# Patient Record
Sex: Male | Born: 1960 | Race: White | Hispanic: No | Marital: Single | Smoking: Former smoker
Health system: Southern US, Community
[De-identification: ages and names within clinical notes are randomized; demographics above are authoritative.]

## PROBLEM LIST (undated history)

## (undated) DIAGNOSIS — I10 Essential (primary) hypertension: Secondary | ICD-10-CM

## (undated) DIAGNOSIS — N289 Disorder of kidney and ureter, unspecified: Secondary | ICD-10-CM

## (undated) DIAGNOSIS — M5020 Other cervical disc displacement, unspecified cervical region: Secondary | ICD-10-CM

## (undated) DIAGNOSIS — R569 Unspecified convulsions: Secondary | ICD-10-CM

## (undated) DIAGNOSIS — E119 Type 2 diabetes mellitus without complications: Secondary | ICD-10-CM

## (undated) HISTORY — DX: Other cervical disc displacement, unspecified cervical region: M50.20

---

## 2004-09-16 ENCOUNTER — Ambulatory Visit: Payer: Self-pay | Admitting: Family Medicine

## 2005-07-16 ENCOUNTER — Ambulatory Visit: Payer: Self-pay | Admitting: Pain Medicine

## 2005-08-06 ENCOUNTER — Emergency Department: Payer: Self-pay | Admitting: Emergency Medicine

## 2005-08-10 ENCOUNTER — Ambulatory Visit: Payer: Self-pay | Admitting: Urology

## 2005-08-22 ENCOUNTER — Ambulatory Visit: Payer: Self-pay | Admitting: Pain Medicine

## 2005-08-22 ENCOUNTER — Ambulatory Visit: Payer: Self-pay | Admitting: Urology

## 2005-09-05 ENCOUNTER — Ambulatory Visit: Payer: Self-pay | Admitting: Pain Medicine

## 2005-10-04 ENCOUNTER — Ambulatory Visit: Payer: Self-pay | Admitting: Physician Assistant

## 2006-01-29 ENCOUNTER — Ambulatory Visit: Payer: Self-pay | Admitting: Physician Assistant

## 2006-06-03 ENCOUNTER — Ambulatory Visit: Payer: Self-pay | Admitting: Physician Assistant

## 2006-09-30 ENCOUNTER — Ambulatory Visit: Payer: Self-pay | Admitting: Physician Assistant

## 2006-10-09 ENCOUNTER — Ambulatory Visit: Payer: Self-pay | Admitting: Physician Assistant

## 2006-10-29 ENCOUNTER — Ambulatory Visit: Payer: Self-pay | Admitting: Physician Assistant

## 2006-11-28 ENCOUNTER — Ambulatory Visit: Payer: Self-pay | Admitting: Physician Assistant

## 2006-12-25 ENCOUNTER — Ambulatory Visit: Payer: Self-pay | Admitting: Pain Medicine

## 2007-01-27 ENCOUNTER — Ambulatory Visit: Payer: Self-pay | Admitting: Physician Assistant

## 2007-04-28 ENCOUNTER — Ambulatory Visit: Payer: Self-pay | Admitting: Physician Assistant

## 2007-07-28 ENCOUNTER — Ambulatory Visit: Payer: Self-pay | Admitting: Physician Assistant

## 2007-08-22 ENCOUNTER — Ambulatory Visit: Payer: Self-pay | Admitting: Physician Assistant

## 2007-09-18 ENCOUNTER — Ambulatory Visit: Payer: Self-pay | Admitting: Physician Assistant

## 2007-11-18 ENCOUNTER — Ambulatory Visit: Payer: Self-pay | Admitting: Physician Assistant

## 2008-01-16 ENCOUNTER — Ambulatory Visit: Payer: Self-pay | Admitting: Physician Assistant

## 2008-03-15 ENCOUNTER — Ambulatory Visit: Payer: Self-pay | Admitting: Physician Assistant

## 2008-07-06 ENCOUNTER — Ambulatory Visit: Payer: Self-pay | Admitting: Physician Assistant

## 2008-10-05 ENCOUNTER — Ambulatory Visit: Payer: Self-pay | Admitting: Physician Assistant

## 2009-01-04 ENCOUNTER — Ambulatory Visit: Payer: Self-pay | Admitting: Physician Assistant

## 2009-03-31 ENCOUNTER — Ambulatory Visit: Payer: Self-pay | Admitting: Physician Assistant

## 2009-06-28 ENCOUNTER — Ambulatory Visit: Payer: Self-pay | Admitting: Physician Assistant

## 2009-09-01 ENCOUNTER — Ambulatory Visit: Payer: Self-pay | Admitting: Physician Assistant

## 2009-12-29 ENCOUNTER — Ambulatory Visit: Payer: Self-pay | Admitting: Physician Assistant

## 2010-01-19 ENCOUNTER — Ambulatory Visit: Payer: Self-pay | Admitting: Pain Medicine

## 2010-11-27 ENCOUNTER — Ambulatory Visit: Payer: Self-pay | Admitting: Urology

## 2013-02-04 ENCOUNTER — Ambulatory Visit: Payer: Self-pay

## 2013-02-23 ENCOUNTER — Ambulatory Visit: Payer: Self-pay

## 2013-03-02 ENCOUNTER — Ambulatory Visit: Payer: Self-pay

## 2013-10-13 ENCOUNTER — Ambulatory Visit: Payer: Self-pay | Admitting: Urology

## 2014-08-15 IMAGING — CR DG ABDOMEN 1V
1 series · 2 of 2 positions shown · non-contrast
Comparison: none

REASON FOR EXAM: kidney stone
COMMENTS:

PROCEDURE:     KDR - KDXR KIDNEY URETER BLADDER  - February 04, 2013  [DATE]
RESULT:     Comparisons:  11/27/2010

[Series 1: supine kub · 0.17mm/px · 2 of 2 slices shown]
[im 1/2]
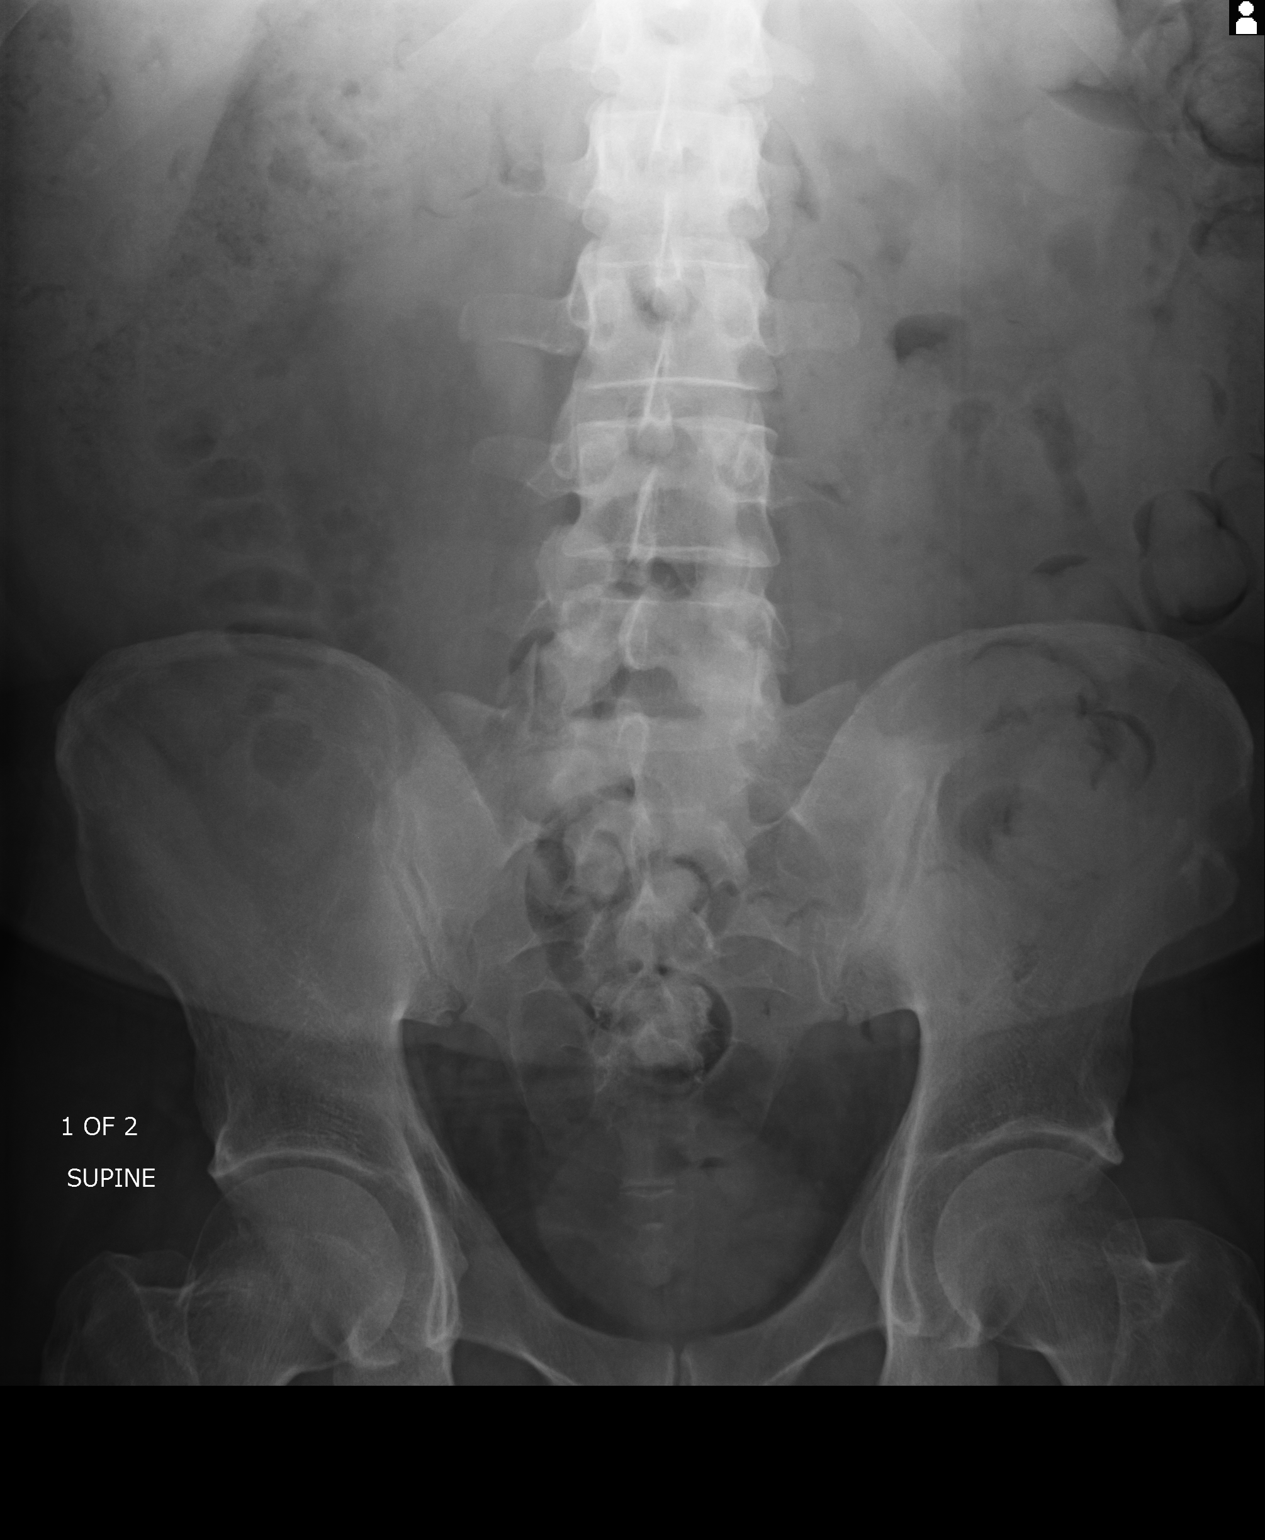
[im 2/2]
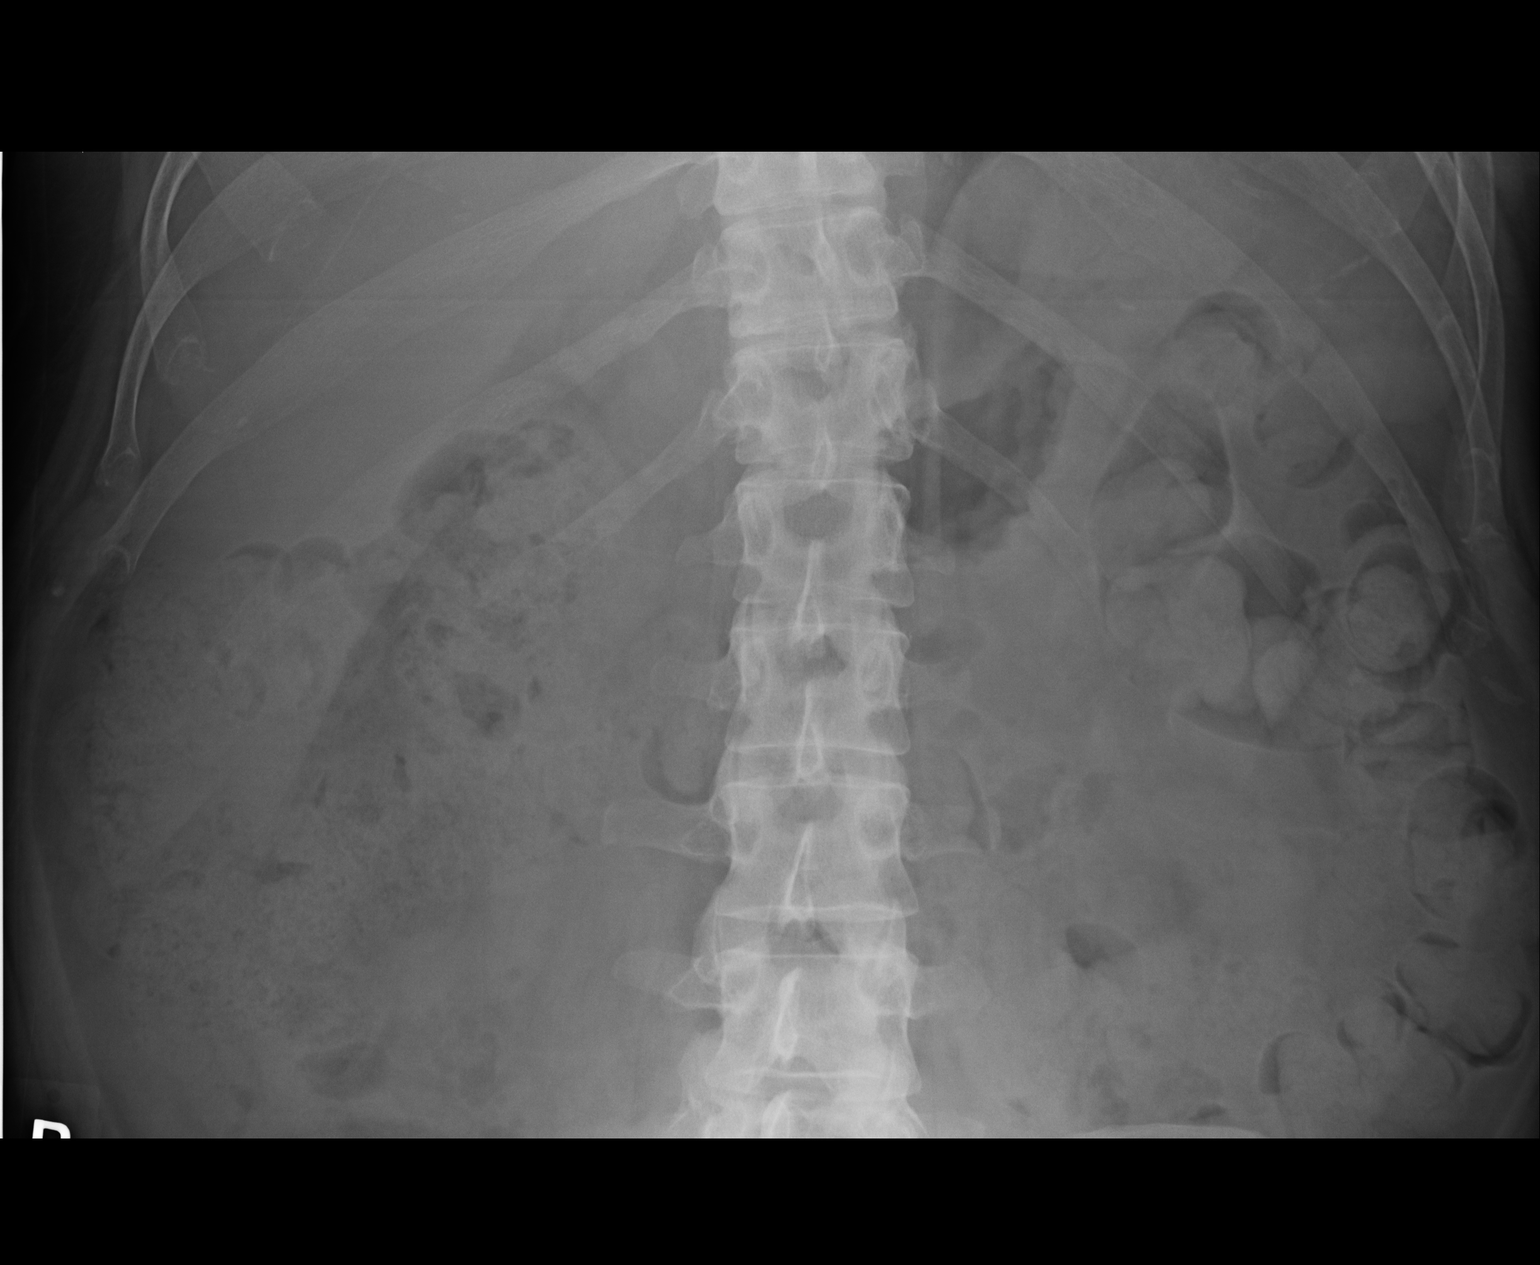

[2 of 2 positions shown; findings below may reference images not displayed]

FINDINGS: Supine radiograph of the abdomen is provided.

There is a nonspecific bowel gas pattern. There is no bowel dilatation to
suggest obstruction. Probable left nephrolithiasis. There is no pathologic
calcification along the expected course of the ureters. There is no evidence
of pneumoperitoneum, portal venous gas, or pneumatosis.

The osseous structures are unremarkable.
IMPRESSION: Probable left nephrolithiasis.

[REDACTED]

## 2017-12-11 ENCOUNTER — Other Ambulatory Visit: Payer: Self-pay | Admitting: Internal Medicine

## 2017-12-12 ENCOUNTER — Ambulatory Visit: Payer: Medicaid Other | Admitting: Nurse Practitioner

## 2017-12-12 ENCOUNTER — Encounter: Payer: Self-pay | Admitting: Nurse Practitioner

## 2017-12-12 VITALS — BP 155/90 | HR 81 | Resp 16 | Ht 71.0 in | Wt 215.8 lb

## 2017-12-12 DIAGNOSIS — E040 Nontoxic diffuse goiter: Secondary | ICD-10-CM | POA: Diagnosis not present

## 2017-12-12 DIAGNOSIS — L239 Allergic contact dermatitis, unspecified cause: Secondary | ICD-10-CM | POA: Insufficient documentation

## 2017-12-12 DIAGNOSIS — R21 Rash and other nonspecific skin eruption: Secondary | ICD-10-CM | POA: Insufficient documentation

## 2017-12-12 DIAGNOSIS — I10 Essential (primary) hypertension: Secondary | ICD-10-CM

## 2017-12-12 DIAGNOSIS — E1142 Type 2 diabetes mellitus with diabetic polyneuropathy: Secondary | ICD-10-CM | POA: Insufficient documentation

## 2017-12-12 DIAGNOSIS — E162 Hypoglycemia, unspecified: Secondary | ICD-10-CM | POA: Insufficient documentation

## 2017-12-12 DIAGNOSIS — Z125 Encounter for screening for malignant neoplasm of prostate: Secondary | ICD-10-CM | POA: Diagnosis not present

## 2017-12-12 DIAGNOSIS — K219 Gastro-esophageal reflux disease without esophagitis: Secondary | ICD-10-CM | POA: Insufficient documentation

## 2017-12-12 DIAGNOSIS — Z79891 Long term (current) use of opiate analgesic: Secondary | ICD-10-CM | POA: Insufficient documentation

## 2017-12-12 DIAGNOSIS — E1169 Type 2 diabetes mellitus with other specified complication: Secondary | ICD-10-CM | POA: Insufficient documentation

## 2017-12-12 DIAGNOSIS — E1165 Type 2 diabetes mellitus with hyperglycemia: Secondary | ICD-10-CM | POA: Diagnosis not present

## 2017-12-12 DIAGNOSIS — R635 Abnormal weight gain: Secondary | ICD-10-CM | POA: Insufficient documentation

## 2017-12-12 DIAGNOSIS — R0602 Shortness of breath: Secondary | ICD-10-CM | POA: Insufficient documentation

## 2017-12-12 DIAGNOSIS — M545 Low back pain, unspecified: Secondary | ICD-10-CM | POA: Insufficient documentation

## 2017-12-12 DIAGNOSIS — R110 Nausea: Secondary | ICD-10-CM | POA: Insufficient documentation

## 2017-12-12 DIAGNOSIS — E1159 Type 2 diabetes mellitus with other circulatory complications: Secondary | ICD-10-CM | POA: Insufficient documentation

## 2017-12-12 DIAGNOSIS — E782 Mixed hyperlipidemia: Secondary | ICD-10-CM | POA: Diagnosis not present

## 2017-12-12 DIAGNOSIS — R309 Painful micturition, unspecified: Secondary | ICD-10-CM

## 2017-12-12 DIAGNOSIS — B353 Tinea pedis: Secondary | ICD-10-CM | POA: Insufficient documentation

## 2017-12-12 DIAGNOSIS — J4520 Mild intermittent asthma, uncomplicated: Secondary | ICD-10-CM

## 2017-12-12 DIAGNOSIS — E042 Nontoxic multinodular goiter: Secondary | ICD-10-CM | POA: Diagnosis not present

## 2017-12-12 DIAGNOSIS — G8929 Other chronic pain: Secondary | ICD-10-CM | POA: Insufficient documentation

## 2017-12-12 DIAGNOSIS — N201 Calculus of ureter: Secondary | ICD-10-CM | POA: Insufficient documentation

## 2017-12-12 DIAGNOSIS — M150 Primary generalized (osteo)arthritis: Secondary | ICD-10-CM

## 2017-12-12 DIAGNOSIS — R300 Dysuria: Secondary | ICD-10-CM | POA: Insufficient documentation

## 2017-12-12 DIAGNOSIS — G894 Chronic pain syndrome: Secondary | ICD-10-CM | POA: Insufficient documentation

## 2017-12-12 DIAGNOSIS — R05 Cough: Secondary | ICD-10-CM | POA: Insufficient documentation

## 2017-12-12 DIAGNOSIS — R1084 Generalized abdominal pain: Secondary | ICD-10-CM | POA: Insufficient documentation

## 2017-12-12 DIAGNOSIS — R059 Cough, unspecified: Secondary | ICD-10-CM | POA: Insufficient documentation

## 2017-12-12 DIAGNOSIS — K5900 Constipation, unspecified: Secondary | ICD-10-CM | POA: Insufficient documentation

## 2017-12-12 DIAGNOSIS — R42 Dizziness and giddiness: Secondary | ICD-10-CM | POA: Insufficient documentation

## 2017-12-12 LAB — POCT GLYCOSYLATED HEMOGLOBIN (HGB A1C): HEMOGLOBIN A1C: 7.7

## 2017-12-12 MED ORDER — MELOXICAM 7.5 MG PO TABS: 7.5000 mg | ORAL_TABLET | Freq: Two times a day (BID) | ORAL | 3 refills | Status: DC

## 2017-12-12 MED ORDER — GLIMEPIRIDE 4 MG PO TABS: 4.0000 mg | ORAL_TABLET | Freq: Every day | ORAL | 3 refills | Status: DC

## 2017-12-12 NOTE — Progress Notes (Signed)
Subjective:     Patient ID: Joseph Stark, male   DOB: 1961/08/27, 56 y.o.   MRN: [id number]  Patient is here for routine follow up visit. He is c/o juscle pain in the jaw and neck, causing him to have headaches. 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 Unable to take muscle relaxer because he is on morphine for renal stone pain disorder. He is complaining of severe muscle cramping and pain in shoulders, neck, and lower legs . States that he had "black out" episode while in our waiting room. Has seen neurologist/endocrinologist in the past. diagnossed with pseudoseizures. Takes gabapentin to help neuropathy, which does help these black out episodes. 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    Current Outpatient Medications:  .  beclomethasone (QVAR) 40 MCG/ACT inhaler, Inhale 2 puffs into the lungs 2 (two) times daily., Disp: , Rfl:  .  fexofenadine (ALLEGRA) 180 MG tablet, Take 180 mg by mouth daily., Disp: , Rfl:  .  gabapentin (NEURONTIN) 300 MG capsule, Take 300 mg by mouth 3 (three) times daily. Take 1 cap every morning,midday, and 2 at bedtime., Disp: , Rfl:  .  glimepiride (AMARYL) 4 MG tablet, Take 4 mg by mouth daily with supper., Disp: , Rfl:  .  glucose blood test strip, 1 each by Other route 3 (three) times daily. Use as instructed, Disp: , Rfl:  .  Lancets Misc. (ACCU-CHEK SOFTCLIX LANCET DEV) KIT, by Does not apply route., Disp: , Rfl:  .  meloxicam (MOBIC) 7.5 MG tablet, TAKE 1 TABLET BY MOUTH TWICE DAILY, Disp: 60 tablet, Rfl: 0 .  metFORMIN (GLUCOPHAGE) 1000 MG tablet, Take 1,000 mg by mouth 2 (two) times daily with a meal. Take one tablet with breakfast and dinner., Disp: , Rfl:  .  metoprolol succinate (TOPROL-XL) 100 MG 24 hr tablet, Take 100 mg by mouth daily. Take with or immediately following a meal., Disp: , Rfl:  .  morphine (MSIR) 30 MG tablet, Take 30 mg by  mouth every 12 (twelve) hours., Disp: , Rfl:  .  omeprazole (PRILOSEC) 40 MG capsule, Take 40 mg by mouth daily., Disp: , Rfl:  .  pravastatin (PRAVACHOL) 40 MG tablet, Take 40 mg by mouth at bedtime., Disp: , Rfl:  .  promethazine (PHENERGAN) 12.5 MG tablet, Take 12.5 mg by mouth 2 (two) times daily as needed for nausea or vomiting., Disp: , Rfl:  .  ramipril (ALTACE) 10 MG capsule, Take 10 mg by mouth daily., Disp: , Rfl:  .  triamcinolone cream (KENALOG) 0.1 %, Apply 1 application topically as needed., Disp: , Rfl:  .  fenofibrate (TRICOR) 145 MG tablet, Take 145 mg by mouth daily., Disp: , Rfl:  .  nystatin-triamcinolone (MYCOLOG II) cream, Apply 1 application topically 2 (two) times daily., Disp: , Rfl:    Review of Systems  Constitutional: Negative.   HENT: Negative.   Eyes: Negative.   Cardiovascular: Negative.   Endocrine: Positive for heat intolerance.       States that his bloodsugars are generally running around 140 Thyroid enlarged  Musculoskeletal: Positive for arthralgias, back pain, myalgias and neck pain.  Allergic/Immunologic: Negative.   Neurological: Positive for seizures and headaches.  Hematological: Negative.   Psychiatric/Behavioral: Positive for agitation.       Today's Vitals   12/12/17 1117  BP: (!) 155/90  Pulse: 81  Resp: 16  SpO2: 96%  Weight: 215 lb 12.8 oz (97.9 kg)  Height: 5' 11"  (1.803 m)    Objective:   Physical Exam  Constitutional: He is oriented to person, place, and time. He appears well-developed and well-nourished.  HENT:  Head: Normocephalic and atraumatic.  Eyes: Pupils are equal, round, and reactive to light.  Neck: Normal range of motion. Neck supple. Thyromegaly present.  Cardiovascular: Normal rate and regular rhythm.  Pulmonary/Chest: Effort normal and breath sounds normal.  Abdominal: Soft. He exhibits distension. There is no tenderness.  Neurological: He is alert and oriented to person, place, and time.  Nursing note and  vitals reviewed.      Assessment:     Type 2 diabetes mellitus with hyperglycemia, unspecified whether long term insulin use ([place]) - Plan: POCT HgB A1C, CBC with Differential/Platelet, Comprehensive metabolic panel, Urine Microalbumin w/creat. ratio, glimepiride (AMARYL) 4 MG tablet, Consult to ophthalmology  Essential (primary) hypertension  Nontoxic multinodular goiter - Plan: T4, free  Nontoxic diffuse goiter - Plan: US Soft Tissue Head/Neck, TSH  Primary generalized (osteo)arthritis - Plan: meloxicam (MOBIC) 7.5 MG tablet  Mild intermittent asthma, uncomplicated  Mixed hyperlipidemia - Plan: Lipid panel  Screening for prostate cancer - Plan: PSA     Plan:     1. HgbA1c 7.7 today. Increased amaryl to 22m daily with meals. Reduce metformin to 1/2 tablet twice daily. Continue bydurean as prescribed.  Discussed dietary changes to help lower blood sugar.  2. bp stable. Continue bp meds as prescribed 3. Check thyroid panel and repeat thyroid ultrasound. Send results to ENT for review. 4. Renew meloxicam 7.579mtwice daily 5. Continue to use inhalers as needed and as prescribed  6. Fasting lipid panel ordered 7. PSA ordered.   Follow up 3 months and sooner if needed

## 2018-01-15 ENCOUNTER — Emergency Department: Payer: Medicaid Other

## 2018-01-15 ENCOUNTER — Emergency Department
Admission: EM | Admit: 2018-01-15 | Discharge: 2018-01-15 | Disposition: A | Discharge status: Home / Self Care | Payer: Medicaid Other | Attending: Emergency Medicine | Admitting: Emergency Medicine

## 2018-01-15 ENCOUNTER — Encounter: Payer: Self-pay | Admitting: Emergency Medicine

## 2018-01-15 ENCOUNTER — Other Ambulatory Visit: Payer: Self-pay

## 2018-01-15 ENCOUNTER — Telehealth: Payer: Self-pay

## 2018-01-15 DIAGNOSIS — I10 Essential (primary) hypertension: Secondary | ICD-10-CM | POA: Insufficient documentation

## 2018-01-15 DIAGNOSIS — R748 Abnormal levels of other serum enzymes: Secondary | ICD-10-CM | POA: Diagnosis not present

## 2018-01-15 DIAGNOSIS — Z87891 Personal history of nicotine dependence: Secondary | ICD-10-CM | POA: Diagnosis not present

## 2018-01-15 DIAGNOSIS — Z79899 Other long term (current) drug therapy: Secondary | ICD-10-CM | POA: Diagnosis not present

## 2018-01-15 DIAGNOSIS — R197 Diarrhea, unspecified: Secondary | ICD-10-CM | POA: Insufficient documentation

## 2018-01-15 DIAGNOSIS — E1143 Type 2 diabetes mellitus with diabetic autonomic (poly)neuropathy: Secondary | ICD-10-CM | POA: Insufficient documentation

## 2018-01-15 DIAGNOSIS — Z880 Allergy status to penicillin: Secondary | ICD-10-CM | POA: Insufficient documentation

## 2018-01-15 DIAGNOSIS — Z7984 Long term (current) use of oral hypoglycemic drugs: Secondary | ICD-10-CM | POA: Insufficient documentation

## 2018-01-15 HISTORY — DX: Essential (primary) hypertension: I10

## 2018-01-15 HISTORY — DX: Disorder of kidney and ureter, unspecified: N28.9

## 2018-01-15 HISTORY — DX: Unspecified convulsions: R56.9

## 2018-01-15 HISTORY — DX: Type 2 diabetes mellitus without complications: E11.9

## 2018-01-15 LAB — COMPREHENSIVE METABOLIC PANEL
ALK PHOS: 48 U/L (ref 38–126)
ALT: 44 U/L (ref 17–63)
ANION GAP: 12 (ref 5–15)
AST: 40 U/L (ref 15–41)
Albumin: 4.6 g/dL (ref 3.5–5.0)
BUN: 9 mg/dL (ref 6–20)
CALCIUM: 10.2 mg/dL (ref 8.9–10.3)
CHLORIDE: 102 mmol/L (ref 101–111)
CO2: 26 mmol/L (ref 22–32)
Creatinine, Ser: 0.98 mg/dL (ref 0.61–1.24)
GFR calc non Af Amer: >60 mL/min (ref >60)
Glucose, Bld: 169 mg/dL — ABNORMAL HIGH (ref 65–99)
Potassium: 4.5 mmol/L (ref 3.5–5.1)
SODIUM: 140 mmol/L (ref 135–145)
Total Bilirubin: 0.4 mg/dL (ref 0.3–1.2)
Total Protein: 8.2 g/dL — ABNORMAL HIGH (ref 6.5–8.1)

## 2018-01-15 LAB — CBC
HCT: 42 % (ref 40.0–52.0)
HEMOGLOBIN: 14.1 g/dL (ref 13.0–18.0)
MCH: 30.6 pg (ref 26.0–34.0)
MCHC: 33.6 g/dL (ref 32.0–36.0)
MCV: 91 fL (ref 80.0–100.0)
Platelets: 260 10*3/uL (ref 150–440)
RBC: 4.61 MIL/uL (ref 4.40–5.90)
RDW: 14.1 % (ref 11.5–14.5)
WBC: 7.3 10*3/uL (ref 3.8–10.6)

## 2018-01-15 LAB — LIPASE, BLOOD: Lipase: 78 U/L — ABNORMAL HIGH (ref 11–51)

## 2018-01-15 MED ORDER — SODIUM CHLORIDE 0.9 % IV BOLUS (SEPSIS)
1000.0000 mL | INTRAVENOUS | Status: AC
  Administered 2018-01-15: 1000 mL via INTRAVENOUS

## 2018-01-15 MED ORDER — LOPERAMIDE HCL 2 MG PO CAPS
4.0000 mg | ORAL_CAPSULE | Freq: Once | ORAL | Status: AC
  Administered 2018-01-15: 4 mg via ORAL
  Filled 2018-01-15: qty 2

## 2018-01-15 NOTE — ED Notes (Signed)
First Nurse Note:  Patient states he was at Urgent Care and is here because he is "dehydrated".  Lips appear dry and flaky.  Color is pale.  Placed in [place].  Patient states "it's my Metformin".

## 2018-01-15 NOTE — ED Provider Notes (Signed)
[facility] Emergency Department Provider Note  ____________________________________________   First MD Initiated Contact with Patient 01/15/18 1453     (approximate)  I have reviewed the triage vital signs and the nursing notes.   HISTORY  Chief Complaint Diarrhea    HPI Joseph Stark is a 57 y.o. male with medical history as listed below who presents for evaluation of severe diarrhea.  Reports that it started acutely during the early morning at about 5:30 AM and that he has been to the bathroom at least 8 times and used at least 3 rolls of toilet paper.   He says this happens to him about every 90 days and thinks that it is his body's way of building up Metformin in his system and then clearing it all out.  He does not think he needs to be on as much metformin and believes that is the reason he is having stomach issues.  He also said that he is working with his primary care doctor about his neck and a goiter and has a follow-up appointment in 5 days to have an ultrasound of his thyroid to make sure he does not have thyroid cancer or does not need a biopsy.  He thinks this might be messing up his bowels as well.  He has had no recent medication changes.  He says that he has the issues with diarrhea about every 90 days but this is more severe.  He has tried some Imodium but it has not helped.  He denies fever/chills, chest pain, shortness of breath, vomiting, and abdominal pain.  He has had some nausea but not recently.  He  Denies dysuria but also says he has not peed for 10 hours and he is afraid he is dehydrated. Past Medical History:  Diagnosis Date  . Diabetes mellitus without complication ([place])   . Hypertension   . Renal disorder   . Seizures [facility])     Patient Active Problem List   Diagnosis Date Noted  . Rash and other nonspecific skin eruption 12/12/2017  . Nontoxic multinodular goiter 12/12/2017  . Calculus of ureter 12/12/2017  . Nontoxic  diffuse goiter 12/12/2017  . Allergic contact dermatitis, unspecified cause 12/12/2017  . Essential (primary) hypertension 12/12/2017  . Chronic pain syndrome 12/12/2017  . Hypoglycemia, unspecified 12/12/2017  . Primary generalized (osteo)arthritis 12/12/2017  . Low back pain 12/12/2017  . Dizziness and giddiness 12/12/2017  . Constipation, unspecified 12/12/2017  . Gastro-esophageal reflux disease without esophagitis 12/12/2017  . Hypercalcemia 12/12/2017  . Nausea 12/12/2017  . Painful micturition 12/12/2017  . Generalized abdominal pain 12/12/2017  . Long term (current) use of opiate analgesic 12/12/2017  . Type 2 diabetes mellitus with diabetic polyneuropathy ([place]) 12/12/2017  . Tinea pedis 12/12/2017  . Cough 12/12/2017  . Mild intermittent asthma, uncomplicated [id number]/3709  . Shortness of breath 12/12/2017  . Abnormal weight gain 12/12/2017  . Mixed hyperlipidemia 12/12/2017  . Type 2 diabetes mellitus with hyperglycemia ([place]) 12/12/2017    History reviewed. No pertinent surgical history.  Prior to Admission medications   Medication Sig Start Date End Date Taking? Authorizing Provider  beclomethasone (QVAR) 40 MCG/ACT inhaler Inhale 2 puffs into the lungs 2 (two) times daily.    [provider]  fenofibrate (TRICOR) 145 MG tablet Take 145 mg by mouth daily.    [provider]  fexofenadine (ALLEGRA) 180 MG tablet Take 180 mg by mouth daily.    [provider]  gabapentin (NEURONTIN) 300 MG  capsule Take 300 mg by mouth 3 (three) times daily. Take 1 cap every morning,midday, and 2 at bedtime.    [provider]  glimepiride (AMARYL) 4 MG tablet Take 1 tablet (4 mg total) by mouth daily with supper. 12/12/17   Ronnell Freshwater, NP  glucose blood test strip 1 each by Other route 3 (three) times daily. Use as instructed    [provider]  Lancets Misc. (ACCU-CHEK SOFTCLIX LANCET DEV) KIT by Does not apply route.    [provider]  meloxicam (MOBIC) 7.5 MG tablet Take 1 tablet (7.5 mg total) by mouth 2 (two) times daily. 12/12/17   Ronnell Freshwater, NP  metFORMIN (GLUCOPHAGE) 1000 MG tablet Take 1,000 mg by mouth 2 (two) times daily with a meal. Take one tablet with breakfast and dinner.    [provider]  metoprolol succinate (TOPROL-XL) 100 MG 24 hr tablet Take 100 mg by mouth daily. Take with or immediately following a meal.    [provider]  morphine (MSIR) 30 MG tablet Take 30 mg by mouth every 12 (twelve) hours.    [provider]  nystatin-triamcinolone (MYCOLOG II) cream Apply 1 application topically 2 (two) times daily.    [provider]  omeprazole (PRILOSEC) 40 MG capsule Take 40 mg by mouth daily.    [provider]  pravastatin (PRAVACHOL) 40 MG tablet Take 40 mg by mouth at bedtime.    [provider]  promethazine (PHENERGAN) 12.5 MG tablet Take 12.5 mg by mouth 2 (two) times daily as needed for nausea or vomiting.    [provider]  ramipril (ALTACE) 10 MG capsule Take 10 mg by mouth daily.    [provider]  triamcinolone cream (KENALOG) 0.1 % Apply 1 application topically as needed.    [provider]    Allergies Bacitracin; Celebrex [celecoxib]; Penicillins; and Toradol [ketorolac tromethamine]  No family history on file.  Social History Social History   Tobacco Use  . Smoking status: Former Research scientist (life sciences)  . Smokeless tobacco: Never Used  . Tobacco comment: quit 25 years ago  Substance Use Topics  . Alcohol use: No    Frequency: Never  . Drug use: No    Review of Systems Constitutional: No fever/chills Eyes: No visual changes. ENT: No sore throat but he does report having a very dry mouth Cardiovascular: Denies chest pain. Respiratory: Denies shortness of breath. Gastrointestinal: Severe diarrhea as described above.  No recent nausea, no vomiting, no abdominal pain. Genitourinary: Decreased  urination today.  Negative for dysuria. Musculoskeletal: Negative for neck pain.  Negative for back pain. Integumentary: Negative for rash. Neurological: Negative for headaches, focal weakness or numbness.   ____________________________________________   PHYSICAL EXAM:  VITAL SIGNS: ED Triage Vitals  Enc Vitals Group     BP 01/15/18 1215 (!) 160/76     Pulse Rate 01/15/18 1215 95     Resp 01/15/18 1215 16     Temp 01/15/18 1215 98.1 F (36.7 C)     Temp Source 01/15/18 1215 Oral     SpO2 01/15/18 1215 94 %     Weight 01/15/18 1219 97.5 kg (215 lb)     Height --      Head Circumference --      Peak Flow --      Pain Score --      Pain Loc --      Pain Edu? --      Excl. in [place]? --  Constitutional: Alert and oriented. Well appearing and in no acute distress though he does appear pale Eyes: Conjunctivae are normal.  Head: Atraumatic. Nose: No congestion/rhinnorhea. Mouth/Throat: Mucous membranes are moist. Neck: No stridor.  No meningeal signs.   Cardiovascular: Borderline tachycardia, regular rhythm. Good peripheral circulation. Grossly normal heart sounds. Respiratory: Normal respiratory effort.  No retractions. Lungs CTAB. Gastrointestinal: Soft and nontender with no rebound and no guarding.  Nondistended Musculoskeletal: No lower extremity tenderness nor edema. No gross deformities of extremities. Neurologic:  Normal speech and language. No gross focal neurologic deficits are appreciated.  Skin:  Skin is pale, warm, dry and intact. No rash noted. Psychiatric: Mood and affect are normal. Speech and behavior are normal.  ____________________________________________   LABS (all labs ordered are listed, but only abnormal results are displayed)  Labs Reviewed  LIPASE, BLOOD - Abnormal; Notable for the following components:      Result Value   Lipase 78 (*)    All other components within normal limits  COMPREHENSIVE METABOLIC PANEL - Abnormal; Notable for the  following components:   Glucose, Bld 169 (*)    Total Protein 8.2 (*)    All other components within normal limits  CBC  URINALYSIS, COMPLETE (UACMP) WITH MICROSCOPIC   ____________________________________________  EKG  None - EKG not ordered by ED physician ____________________________________________  RADIOLOGY Ursula Alert, personally viewed and evaluated these images (plain radiographs) as part of my medical decision making, as well as reviewing the written report by the radiologist.  ED MD interpretation:  No acute abnormalities on acute abdomen series  Official radiology report(s): Dg Abdomen Acute W/chest  Result Date: 01/15/2018 CLINICAL DATA:  Dehydration EXAM: DG ABDOMEN ACUTE W/ 1V CHEST COMPARISON:  Abdominal radiograph dated 10/05/2013. FINDINGS: Lungs are clear.  No pleural effusion or pneumothorax. Heart is normal in size. Nonobstructive bowel gas pattern. No evidence of free air under the diaphragm on the upright view. Mild thoracic dextroscoliosis. IMPRESSION: No evidence of acute cardiopulmonary disease. No evidence of small bowel obstruction or free air. Electronically Signed   By: Julian Hy M.D.   On: 01/15/2018 15:50     ____________________________________________   PROCEDURES  Critical Care performed: No   Procedure(s) performed:   Procedures   ____________________________________________   INITIAL IMPRESSION / ASSESSMENT AND PLAN / ED COURSE  As part of my medical decision making, I reviewed the following data within the [place] notes reviewed and incorporated, Labs reviewed  and Radiograph reviewed     Differential diagnosis includes, but is not limited to, viral diarrheal illness, bacterial colitis, medication side effect, metabolic abnormality.  In spite of the patient's profuse diarrhea, his lab results are reassuring with no evidence of renal dysfunction nor electrolyte abnormality.  He has a very  slightly elevated lipase which is nonspecific and could be a result of the diarrhea.  I think it is less likely he has acute pancreatitis given no abdominal pain nor tenderness and no vomiting.  He does state that he has diarrhea every time he tries to eat or drink something but this would be an unusual presentation of pancreatitis.  I will give him a dose of loperamide, check acute abdomen radiographs for air-fluid levels, and give him 1-2 L of fluid (depending upon how he feels).  He is comfortable following up with his doctor next week as planned, he is just very worried (almost perseverating) about being dehydrated.   Clinical Course as of Jan 15 1637  Wed  Jan 15, 2018  1556 No evidence of acute abnormalities DG Abdomen Acute W/Chest [CF]  1636 Patient states he feels better and is ready to go.    I gave my usual and customary return precautions.   [CF]    Clinical Course User Index [CF] Hinda Kehr, MD     ____________________________________________  FINAL CLINICAL IMPRESSION(S) / ED DIAGNOSES  Final diagnoses:  Diarrhea, unspecified type  Elevated lipase     MEDICATIONS GIVEN DURING THIS VISIT:  Medications  sodium chloride 0.9 % bolus 1,000 mL (1,000 mLs Intravenous New Bag/Given 01/15/18 1506)  loperamide (IMODIUM) capsule 4 mg (4 mg Oral Given 01/15/18 1509)      ED Discharge Orders    None       Note:  This document was prepared using Dragon voice recognition software and may include unintentional dictation errors.    Hinda Kehr, MD 01/15/18 [phone]

## 2018-01-15 NOTE — ED Notes (Signed)
ED Provider at bedside. 

## 2018-01-15 NOTE — ED Triage Notes (Signed)
Pt to ED via POV, pt states that he has been having diarrhea since this morning at 0530. Pt denies N/V or abdominal pain. Pt states that he may be dehydrated. Pt in NAD at this time.

## 2018-01-15 NOTE — Telephone Encounter (Signed)
UNABLE TO REACH PT, TRIED CALLING TO [place] [facility] FOR 01/20/18

## 2018-01-15 NOTE — ED Notes (Signed)
Repeat VS: Pulse 0x 97%, BP - 131/80, P-81, R-20.  Alert and oriented.  Wait explained to patient.

## 2018-01-20 ENCOUNTER — Other Ambulatory Visit: Payer: Self-pay

## 2018-01-22 ENCOUNTER — Other Ambulatory Visit: Payer: Self-pay | Admitting: Internal Medicine

## 2018-01-27 ENCOUNTER — Other Ambulatory Visit: Payer: Self-pay

## 2018-02-03 ENCOUNTER — Ambulatory Visit: Payer: Medicaid Other

## 2018-02-03 DIAGNOSIS — E040 Nontoxic diffuse goiter: Secondary | ICD-10-CM

## 2018-02-20 ENCOUNTER — Other Ambulatory Visit: Payer: Self-pay | Admitting: Internal Medicine

## 2018-03-07 ENCOUNTER — Ambulatory Visit: Payer: Self-pay | Admitting: Nurse Practitioner

## 2018-03-12 ENCOUNTER — Other Ambulatory Visit: Payer: Self-pay | Admitting: Internal Medicine

## 2018-03-31 ENCOUNTER — Ambulatory Visit: Payer: Medicaid Other | Admitting: Nurse Practitioner

## 2018-03-31 ENCOUNTER — Encounter: Payer: Self-pay | Admitting: Nurse Practitioner

## 2018-03-31 VITALS — BP 143/81 | HR 82 | Resp 16 | Ht 71.0 in | Wt 214.8 lb

## 2018-03-31 DIAGNOSIS — E042 Nontoxic multinodular goiter: Secondary | ICD-10-CM | POA: Diagnosis not present

## 2018-03-31 DIAGNOSIS — E782 Mixed hyperlipidemia: Secondary | ICD-10-CM | POA: Diagnosis not present

## 2018-03-31 DIAGNOSIS — I10 Essential (primary) hypertension: Secondary | ICD-10-CM

## 2018-03-31 DIAGNOSIS — E1165 Type 2 diabetes mellitus with hyperglycemia: Secondary | ICD-10-CM

## 2018-03-31 LAB — POCT GLYCOSYLATED HEMOGLOBIN (HGB A1C): HEMOGLOBIN A1C: 8.1

## 2018-03-31 MED ORDER — GLIMEPIRIDE 4 MG PO TABS: 4.0000 mg | ORAL_TABLET | Freq: Two times a day (BID) | ORAL | 3 refills | Status: DC

## 2018-03-31 MED ORDER — METFORMIN HCL 1000 MG PO TABS: 500.0000 mg | ORAL_TABLET | Freq: Two times a day (BID) | ORAL | 7 refills | Status: DC

## 2018-03-31 NOTE — Progress Notes (Signed)
[facility] [place], [place] [address]  Internal MEDICINE  Office Visit Note  Patient Name: Joseph Stark  [id number]  [id number]  Date of Service: 04/20/2018    Pt is here for routine follow up.   Chief Complaint  Patient presents with  . Diabetes    numbness in feet , pain in right hallux  . Osteoarthritis    pain in hands, kenes, and feet.    The patient is here for routine follow up visit. Blood sugars are elevated. Reduced dose of metformin at night due to development of diarrhea. HgbA1c has increased to 8.0 today, up from 7.7. He states that every time he takes increased dose of metformin at night, diarrhea starts again, every time. He also states that he has pain in the hands, knees, feet, and lower back. States that neurosurgeon diagnosed him with rheumatoid arthritis several years ago. Is taking meloxicam as needed, but feels like it's not helping as much as it used to.      Current Medication: Outpatient Encounter Medications as of 03/31/2018  Medication Sig  . beclomethasone (QVAR) 40 MCG/ACT inhaler Inhale 2 puffs into the lungs 2 (two) times daily.  Marland Kitchen BYDUREON 2 MG PEN USE 2 MG PEN INJECTED [place] ONCE WEEKLY. TO REPLACE BYETTA  . fenofibrate (TRICOR) 145 MG tablet Take 145 mg by mouth daily.  . fexofenadine (ALLEGRA) 180 MG tablet Take 180 mg by mouth daily.  Marland Kitchen gabapentin (NEURONTIN) 300 MG capsule Take 300 mg by mouth 3 (three) times daily. Take 1 cap every morning,midday, and 2 at bedtime.  Marland Kitchen glimepiride (AMARYL) 4 MG tablet Take 1 tablet (4 mg total) by mouth 2 (two) times daily.  Marland Kitchen glucose blood test strip 1 each by Other route 3 (three) times daily. Use as instructed  . Lancets Misc. (ACCU-CHEK SOFTCLIX LANCET DEV) KIT by Does not apply route.  . meloxicam (MOBIC) 7.5 MG tablet Take 1 tablet (7.5 mg total) by mouth 2 (two) times daily.  . metFORMIN (GLUCOPHAGE) 1000 MG tablet Take 0.5 tablets (500 mg total) by mouth 2 (two) times  daily with a meal.  . metoprolol succinate (TOPROL-XL) 100 MG 24 hr tablet TAKE 1 TABLET BY MOUTH EVERY DAY  . morphine (MSIR) 30 MG tablet Take 30 mg by mouth every 12 (twelve) hours.  Marland Kitchen nystatin-triamcinolone (MYCOLOG II) cream Apply 1 application topically 2 (two) times daily.  Marland Kitchen omeprazole (PRILOSEC) 40 MG capsule Take 40 mg by mouth daily.  . pravastatin (PRAVACHOL) 40 MG tablet TAKE 1 TABLET BY MOUTH EVERYDAY AT BEDTIME  . promethazine (PHENERGAN) 12.5 MG tablet Take 12.5 mg by mouth 2 (two) times daily as needed for nausea or vomiting.  . ramipril (ALTACE) 10 MG capsule Take 10 mg by mouth daily.  Marland Kitchen triamcinolone cream (KENALOG) 0.1 % Apply 1 application topically as needed.  . [DISCONTINUED] glimepiride (AMARYL) 4 MG tablet Take 1 tablet (4 mg total) by mouth daily with supper.  . [DISCONTINUED] metFORMIN (GLUCOPHAGE) 1000 MG tablet TAKE 1 TABLET BY MOUTH TWICE A DAY WITH BREAKFAST AND DINNER   No facility-administered encounter medications on file as of 03/31/2018.     Surgical History: No past surgical history on file.  Medical History: Past Medical History:  Diagnosis Date  . Diabetes mellitus without complication ([place])   . Hypertension   . Renal disorder   . Seizures ([place])     Family History: No family history on file.  Social History   Socioeconomic History  .  Marital status: Single    Spouse name: Not on file  . Number of children: Not on file  . Years of education: Not on file  . Highest education level: Not on file  Occupational History  . Not on file  Social Needs  . Financial resource strain: Not on file  . Food insecurity:    Worry: Not on file    Inability: Not on file  . Transportation needs:    Medical: Not on file    Non-medical: Not on file  Tobacco Use  . Smoking status: Former Research scientist (life sciences)  . Smokeless tobacco: Never Used  . Tobacco comment: quit 25 years ago  Substance and Sexual Activity  . Alcohol use: No    Frequency: Never  . Drug use: No   . Sexual activity: Not Currently  Lifestyle  . Physical activity:    Days per week: Not on file    Minutes per session: Not on file  . Stress: Not on file  Relationships  . Social connections:    Talks on phone: Not on file    Gets together: Not on file    Attends religious service: Not on file    Active member of club or organization: Not on file    Attends meetings of clubs or organizations: Not on file    Relationship status: Not on file  . Intimate partner violence:    Fear of current or ex partner: Not on file    Emotionally abused: Not on file    Physically abused: Not on file    Forced sexual activity: Not on file  Other Topics Concern  . Not on file  Social History Narrative  . Not on file      Review of Systems  Constitutional: Negative.   HENT: Negative.   Eyes: Negative.   Cardiovascular: Negative.   Endocrine: Positive for heat intolerance.       States that his bloodsugars are generally running around 140 Thyroid enlarged  Musculoskeletal: Positive for arthralgias, back pain, myalgias and neck pain.  Skin: Negative for rash.  Allergic/Immunologic: Negative.   Neurological: Positive for seizures and headaches.  Hematological: Negative.   Psychiatric/Behavioral: Positive for agitation.    Today's Vitals   03/31/18 1113  BP: (!) 143/81  Pulse: 82  Resp: 16  SpO2: 95%  Weight: 214 lb 12.8 oz (97.4 kg)  Height: 5' 11"  (1.803 m)     Physical Exam  Constitutional: He is oriented to person, place, and time. He appears well-developed and well-nourished.  HENT:  Head: Normocephalic and atraumatic.  Nose: Nose normal.  Eyes: Pupils are equal, round, and reactive to light. Conjunctivae and EOM are normal.  Neck: Normal range of motion. Neck supple. Thyromegaly present.  Cardiovascular: Normal rate and regular rhythm.  Pulmonary/Chest: Effort normal and breath sounds normal.  Abdominal: Soft. Bowel sounds are normal. He exhibits no distension. There is  no tenderness.  Lymphadenopathy:    He has no cervical adenopathy.  Neurological: He is alert and oriented to person, place, and time.  Skin: No rash noted.  Psychiatric: He has a normal mood and affect. His behavior is normal. Judgment and thought content normal.  Nursing note and vitals reviewed.   Assessment/Plan:  1. Uncontrolled type 2 diabetes mellitus with hyperglycemia (HCC) - POCT HgB A1C 8.1 today. Increase metformin to 523m twice daiy with meals. increrase amaryl 479mto twice daily. Encourage him to follow ADA diet.  - metFORMIN (GLUCOPHAGE) 1000 MG tablet; Take 0.5  tablets (500 mg total) by mouth 2 (two) times daily with a meal.  Dispense: 60 tablet; Refill: 7  2. Type 2 diabetes mellitus with hyperglycemia, unspecified whether long term insulin use (HCC) - glimepiride (AMARYL) 4 MG tablet; Take 1 tablet (4 mg total) by mouth 2 (two) times daily.  Dispense: 60 tablet; Refill: 3  3. Mixed hyperlipidemia Patient needing to have fasting lipid panel checked. Adjust statin as indicated, based on lab results.   4. Essential (primary) hypertension Stable. Continue bp medication as prescribed.   5. Multinodular goiter -  Reviewed ultrasound results showing diffuse multinodular goiter. essentially unchanged from prior study. Will monitor closely. Consider referral to endocrinology after lab results available.    General Counseling: brittain hosie understanding of the findings of todays visit and agrees with plan of treatment. I have discussed any further diagnostic evaluation that may be needed or ordered today. We also reviewed his medications today. he has been encouraged to call the office with any questions or concerns that should arise related to todays visit.  Diabetes Counseling:  1. Addition of ACE inh/ ARB'S for nephroprotection. 2. Diabetic foot care, prevention of complications.  3.Exercise and lose weight.  4. Diabetic eye examination, 5. Monitor blood sugar  closlely. nutrition counseling.  6.Sign and symptoms of hypoglycemia including shaking sweating,confusion and headaches.   This patient was seen by Leretha Pol, FNP- C in Collaboration with Dr Lavera Guise as a part of collaborative care agreement    Orders Placed This Encounter  Procedures  . POCT HgB A1C    Meds ordered this encounter  Medications  . glimepiride (AMARYL) 4 MG tablet    Sig: Take 1 tablet (4 mg total) by mouth 2 (two) times daily.    Dispense:  60 tablet    Refill:  3    Increase glimepiride, reducing dose of metformin.    Order Specific Question:   Supervising Provider    Answer:   Lavera Guise [[id number]]  . metFORMIN (GLUCOPHAGE) 1000 MG tablet    Sig: Take 0.5 tablets (500 mg total) by mouth 2 (two) times daily with a meal.    Dispense:  60 tablet    Refill:  7    Order Specific Question:   Supervising Provider    Answer:   Lavera Guise [[id number]]    Time spent: 33 Minutes     Dr Lavera Guise Internal medicine

## 2018-04-20 DIAGNOSIS — E1165 Type 2 diabetes mellitus with hyperglycemia: Secondary | ICD-10-CM | POA: Insufficient documentation

## 2018-05-27 ENCOUNTER — Other Ambulatory Visit: Payer: Self-pay | Admitting: Internal Medicine

## 2018-07-01 ENCOUNTER — Encounter: Payer: Self-pay | Admitting: Nurse Practitioner

## 2018-07-01 ENCOUNTER — Ambulatory Visit: Payer: Medicaid Other | Admitting: Nurse Practitioner

## 2018-07-01 VITALS — BP 158/82 | HR 77 | Resp 16 | Ht 71.0 in | Wt 214.0 lb

## 2018-07-01 DIAGNOSIS — I10 Essential (primary) hypertension: Secondary | ICD-10-CM

## 2018-07-01 DIAGNOSIS — Z23 Encounter for immunization: Secondary | ICD-10-CM | POA: Diagnosis not present

## 2018-07-01 DIAGNOSIS — E782 Mixed hyperlipidemia: Secondary | ICD-10-CM

## 2018-07-01 DIAGNOSIS — E1165 Type 2 diabetes mellitus with hyperglycemia: Secondary | ICD-10-CM

## 2018-07-01 LAB — POCT GLYCOSYLATED HEMOGLOBIN (HGB A1C): HEMOGLOBIN A1C: 8.2 % — AB (ref 4.0–5.6)

## 2018-07-01 MED ORDER — HYDROCHLOROTHIAZIDE 12.5 MG PO TABS: 12.5000 mg | ORAL_TABLET | Freq: Every day | ORAL | 3 refills | Status: DC

## 2018-07-01 MED ORDER — METFORMIN HCL 1000 MG PO TABS: ORAL_TABLET | ORAL | 5 refills | Status: DC

## 2018-07-01 MED ORDER — PNEUMOCOCCAL VAC POLYVALENT 25 MCG/0.5ML IJ INJ: INJECTION | INTRAMUSCULAR | 0 refills | Status: DC

## 2018-07-01 NOTE — Progress Notes (Signed)
[facility] [place], [place] [address]  Internal MEDICINE  Office Visit Note  Patient Name: Joseph Stark  [id number]  [id number]  Date of Service: 07/26/2018  Chief Complaint  Patient presents with  . Diabetes    3 month follow up  . Foot Pain    The patient is here for routine follow up visit. Blood sugars are elevated. Reduced dose of metformin at night due to development of diarrhea. HgbA1c has increased to 8.2 today, up from 8.1. He states that every time he takes increased dose of metformin at night, diarrhea starts again, every time. Added amaryl 10m twice daily at his last visit, but blood sugars are essentially unchanged.  He also states that he has pain in the hands, knees, feet, and lower back. States that neurosurgeon diagnosed him with rheumatoid arthritis several years ago. Is taking meloxicam as needed, but feels like it's not helping as much as it used to.       Current Medication: Outpatient Encounter Medications as of 07/01/2018  Medication Sig  . ACCU-CHEK SOFTCLIX LANCETS lancets USE AS DIRECTED TO CHECK BLOOD SUGARS THREE TIMES A DAY DX CODE E11.9  . beclomethasone (QVAR) 40 MCG/ACT inhaler Inhale 2 puffs into the lungs 2 (two) times daily.  .Marland KitchenBYDUREON 2 MG PEN USE 2 MG PEN INJECTED [place] ONCE WEEKLY. TO REPLACE BYETTA  . fenofibrate (TRICOR) 145 MG tablet Take 145 mg by mouth daily.  . fexofenadine (ALLEGRA) 180 MG tablet Take 180 mg by mouth daily.  .Marland Kitchengabapentin (NEURONTIN) 300 MG capsule Take 300 mg by mouth 3 (three) times daily. Take 1 cap every morning,midday, and 2 at bedtime.  .Marland Kitchenglimepiride (AMARYL) 4 MG tablet Take 1 tablet (4 mg total) by mouth 2 (two) times daily.  .Marland Kitchenglucose blood test strip 1 each by Other route 3 (three) times daily. Use as instructed  . Lancets Misc. (ACCU-CHEK SOFTCLIX LANCET DEV) KIT by Does not apply route.  . meloxicam (MOBIC) 7.5 MG tablet Take 1 tablet (7.5 mg total) by mouth 2 (two) times daily.   . metFORMIN (GLUCOPHAGE) 1000 MG tablet Take 1 tablet po QAM and take 1/2 tablet po QPM  . metoprolol succinate (TOPROL-XL) 100 MG 24 hr tablet TAKE 1 TABLET BY MOUTH EVERY DAY  . morphine (MSIR) 30 MG tablet Take 30 mg by mouth every 12 (twelve) hours.  .Marland Kitchennystatin-triamcinolone (MYCOLOG II) cream Apply 1 application topically 2 (two) times daily.  .Marland Kitchenomeprazole (PRILOSEC) 40 MG capsule Take 40 mg by mouth daily.  . pravastatin (PRAVACHOL) 40 MG tablet TAKE 1 TABLET BY MOUTH EVERYDAY AT BEDTIME  . promethazine (PHENERGAN) 12.5 MG tablet Take 12.5 mg by mouth 2 (two) times daily as needed for nausea or vomiting.  . ramipril (ALTACE) 10 MG capsule Take 10 mg by mouth daily.  .Marland Kitchentriamcinolone cream (KENALOG) 0.1 % Apply 1 application topically as needed.  . [DISCONTINUED] metFORMIN (GLUCOPHAGE) 1000 MG tablet Take 0.5 tablets (500 mg total) by mouth 2 (two) times daily with a meal.  . hydrochlorothiazide (HYDRODIURIL) 12.5 MG tablet Take 1 tablet (12.5 mg total) by mouth daily.  . pneumococcal 23 valent vaccine (PNEUMOVAX 23) 25 MCG/0.5ML injection Inject 0.579mIM once   No facility-administered encounter medications on file as of 07/01/2018.     Surgical History: History reviewed. No pertinent surgical history.  Medical History: Past Medical History:  Diagnosis Date  . Diabetes mellitus without complication ([place]  . Hypertension   .  Renal disorder   . Seizures ([place])     Family History: History reviewed. No pertinent family history.  Social History   Socioeconomic History  . Marital status: Single    Spouse name: Not on file  . Number of children: Not on file  . Years of education: Not on file  . Highest education level: Not on file  Occupational History  . Not on file  Social Needs  . Financial resource strain: Not on file  . Food insecurity:    Worry: Not on file    Inability: Not on file  . Transportation needs:    Medical: Not on file    Non-medical: Not on file   Tobacco Use  . Smoking status: Former Research scientist (life sciences)  . Smokeless tobacco: Never Used  . Tobacco comment: quit 25 years ago  Substance and Sexual Activity  . Alcohol use: No    Frequency: Never  . Drug use: No  . Sexual activity: Not Currently  Lifestyle  . Physical activity:    Days per week: Not on file    Minutes per session: Not on file  . Stress: Not on file  Relationships  . Social connections:    Talks on phone: Not on file    Gets together: Not on file    Attends religious service: Not on file    Active member of club or organization: Not on file    Attends meetings of clubs or organizations: Not on file    Relationship status: Not on file  . Intimate partner violence:    Fear of current or ex partner: Not on file    Emotionally abused: Not on file    Physically abused: Not on file    Forced sexual activity: Not on file  Other Topics Concern  . Not on file  Social History Narrative  . Not on file      Review of Systems  Constitutional: Negative for activity change, fatigue and unexpected weight change.  HENT: Negative for congestion, postnasal drip, rhinorrhea and sinus pressure.   Eyes: Negative.   Respiratory: Negative for chest tightness, shortness of breath and wheezing.   Cardiovascular: Negative for chest pain and palpitations.       Blood pressure elevated.   Gastrointestinal: Negative for abdominal pain, constipation, diarrhea and vomiting.  Endocrine: Positive for heat intolerance.       States that his bloodsugars are generally running around 140 Thyroid enlarged  Genitourinary: Negative.   Musculoskeletal: Positive for arthralgias, back pain, myalgias and neck pain.  Skin: Negative for rash.  Allergic/Immunologic: Negative for environmental allergies.  Neurological: Positive for seizures and headaches.  Hematological: Negative for adenopathy.  Psychiatric/Behavioral: Positive for agitation. The patient is nervous/anxious.     Today's Vitals    07/01/18 1046  BP: (!) 158/82  Pulse: 77  Resp: 16  SpO2: 95%  Weight: 214 lb (97.1 kg)  Height: 5' 11"  (1.803 m)    Physical Exam  Constitutional: He is oriented to person, place, and time. He appears well-developed and well-nourished.  HENT:  Head: Normocephalic and atraumatic.  Nose: Nose normal.  Mouth/Throat: Oropharynx is clear and moist.  Eyes: Pupils are equal, round, and reactive to light. Conjunctivae and EOM are normal.  Neck: Normal range of motion. Neck supple. Thyromegaly present.  Cardiovascular: Normal rate, regular rhythm and normal heart sounds.  Pulmonary/Chest: Effort normal and breath sounds normal.  Abdominal: Soft. Bowel sounds are normal. He exhibits no distension. There is no tenderness.  Lymphadenopathy:    He has no cervical adenopathy.  Neurological: He is alert and oriented to person, place, and time. No cranial nerve deficit or sensory deficit. He exhibits normal muscle tone.  Skin: Skin is warm and dry. Capillary refill takes less than 2 seconds. No rash noted.  Psychiatric: His speech is normal and behavior is normal. Judgment and thought content normal. His mood appears anxious. Cognition and memory are normal.  Nursing note and vitals reviewed.  Assessment/Plan: 1. Uncontrolled type 2 diabetes mellitus with hyperglycemia (HCC) - POCT HgB A1C increased to 8.2 today. Will increase metformin to 1079m taking 1 tablet in the morning and 1/2 tablet in the evenings. Continue amaryl as prescribed. Refer for diabetic eye exam. - metFORMIN (GLUCOPHAGE) 1000 MG tablet; Take 1 tablet po QAM and take 1/2 tablet po QPM  Dispense: 60 tablet; Refill: 5 - Ambulatory referral to Ophthalmology  2. Essential (primary) hypertension Add HCTZ 12.569mdaily.  - hydrochlorothiazide (HYDRODIURIL) 12.5 MG tablet; Take 1 tablet (12.5 mg total) by mouth daily.  Dispense: 30 tablet; Refill: 3  3. Mixed hyperlipidemia Continue fenofibrate and statin as prescribed.   4. Need  for vaccination against Streptococcus pneumoniae - pneumococcal 23 valent vaccine (PNEUMOVAX 23) 25 MCG/0.5ML injection; Inject 0.69m12mM once  Dispense: 0.5 mL; Refill: 0  5. Need for tetanus, diphtheria, and acellular pertussis (Tdap) vaccine - Tdap vaccine greater than or equal to 7yo IM  General Counseling: Joseph Gillsderstanding of the findings of todays visit and agrees with plan of treatment. I have discussed any further diagnostic evaluation that may be needed or ordered today. We also reviewed his medications today. he has been encouraged to call the office with any questions or concerns that should arise related to todays visit.   Diabetes Counseling:  1. Addition of ACE inh/ ARB'S for nephroprotection. Microalbumin is updated  2. Diabetic foot care, prevention of complications. Podiatry consult 3. Exercise and lose weight.  4. Diabetic eye examination, Diabetic eye exam is updated  5. Monitor blood sugar closlely. nutrition counseling.  6. Sign and symptoms of hypoglycemia including shaking sweating,confusion and headaches.  This patient was seen by HeaLeretha PolP Collaboration with Dr FozLavera Guise a part of collaborative care agreement  Orders Placed This Encounter  Procedures  . Tdap vaccine greater than or equal to 7yo IM  . Ambulatory referral to Ophthalmology  . POCT HgB A1C    Meds ordered this encounter  Medications  . metFORMIN (GLUCOPHAGE) 1000 MG tablet    Sig: Take 1 tablet po QAM and take 1/2 tablet po QPM    Dispense:  60 tablet    Refill:  5    Order Specific Question:   Supervising Provider    Answer:   KHALavera Guise4[[id number]] hydrochlorothiazide (HYDRODIURIL) 12.5 MG tablet    Sig: Take 1 tablet (12.5 mg total) by mouth daily.    Dispense:  30 tablet    Refill:  3    Order Specific Question:   Supervising Provider    Answer:   KHALavera Guise4[[id number]] pneumococcal 23 valent vaccine (PNEUMOVAX 23) 25 MCG/0.5ML injection    Sig: Inject  0.69ml62m once    Dispense:  0.5 mL    Refill:  0    Order Specific Question:   Supervising Provider    Answer:   KHANLavera [id number]]    Time spent: 25 M13utes      Dr FoziTimoteo Gaul  [facility] Internal medicine

## 2018-07-26 DIAGNOSIS — Z23 Encounter for immunization: Secondary | ICD-10-CM | POA: Insufficient documentation

## 2018-08-05 ENCOUNTER — Ambulatory Visit: Payer: Self-pay | Admitting: Nurse Practitioner

## 2018-08-06 ENCOUNTER — Ambulatory Visit: Payer: Medicaid Other | Admitting: Nurse Practitioner

## 2018-08-06 ENCOUNTER — Encounter: Payer: Self-pay | Admitting: Nurse Practitioner

## 2018-08-06 VITALS — BP 127/79 | HR 72 | Resp 16 | Ht 71.0 in | Wt 212.0 lb

## 2018-08-06 DIAGNOSIS — E1165 Type 2 diabetes mellitus with hyperglycemia: Secondary | ICD-10-CM | POA: Diagnosis not present

## 2018-08-06 DIAGNOSIS — I10 Essential (primary) hypertension: Secondary | ICD-10-CM

## 2018-08-06 NOTE — Progress Notes (Signed)
[facility] [place], [place] [address]  Internal MEDICINE  Office Visit Note  Patient Name: Joseph Stark  [id number]  [id number]  Date of Service: 08/06/2018  Chief Complaint  Patient presents with  . Hypertension         The patient had very elevated blood pressure at his last visit. Added HCTZ 12.67m daily. Blood pressure is much improved. Overall, feels good.  Has had some dizziness today. This is generally a sign that seizure is coming on. Takes gabapentin and seizures occur about once yearly. Does not drive. Has seen neurology, but they did not keep him as patient as they were unable to determine the cause of his seizures.  Hypertension  This is a chronic problem. The current episode started more than 1 year ago. The problem has been gradually improving since onset. The problem is resistant. Associated symptoms include blurred vision, headaches and neck pain. Pertinent negatives include no chest pain, palpitations or shortness of breath. There are no associated agents to hypertension. Risk factors for coronary artery disease include dyslipidemia, family history, diabetes mellitus, male gender and stress. Past treatments include diuretics and ACE inhibitors. The current treatment provides moderate improvement. Compliance problems include exercise.  There is no history of sleep apnea.       Current Medication: Outpatient Encounter Medications as of 08/06/2018  Medication Sig  . ACCU-CHEK SOFTCLIX LANCETS lancets USE AS DIRECTED TO CHECK BLOOD SUGARS THREE TIMES A DAY DX CODE E11.9  . beclomethasone (QVAR) 40 MCG/ACT inhaler Inhale 2 puffs into the lungs 2 (two) times daily.  .Marland KitchenBYDUREON 2 MG PEN USE 2 MG PEN INJECTED [place] ONCE WEEKLY. TO REPLACE BYETTA  . fenofibrate (TRICOR) 145 MG tablet Take 145 mg by mouth daily.  . fexofenadine (ALLEGRA) 180 MG tablet Take 180 mg by mouth daily.  .Marland Kitchengabapentin (NEURONTIN) 300 MG capsule Take 300 mg by mouth 3  (three) times daily. Take 1 cap every morning,midday, and 2 at bedtime.  .Marland Kitchenglimepiride (AMARYL) 4 MG tablet Take 1 tablet (4 mg total) by mouth 2 (two) times daily.  .Marland Kitchenglucose blood test strip 1 each by Other route 3 (three) times daily. Use as instructed  . hydrochlorothiazide (HYDRODIURIL) 12.5 MG tablet Take 1 tablet (12.5 mg total) by mouth daily.  . Lancets Misc. (ACCU-CHEK SOFTCLIX LANCET DEV) KIT by Does not apply route.  . meloxicam (MOBIC) 7.5 MG tablet Take 1 tablet (7.5 mg total) by mouth 2 (two) times daily.  . metFORMIN (GLUCOPHAGE) 1000 MG tablet Take 1 tablet po QAM and take 1/2 tablet po QPM  . metoprolol succinate (TOPROL-XL) 100 MG 24 hr tablet TAKE 1 TABLET BY MOUTH EVERY DAY  . morphine (MSIR) 30 MG tablet Take 30 mg by mouth every 12 (twelve) hours.  .Marland Kitchennystatin-triamcinolone (MYCOLOG II) cream Apply 1 application topically 2 (two) times daily.  .Marland Kitchenomeprazole (PRILOSEC) 40 MG capsule Take 40 mg by mouth daily.  . pneumococcal 23 valent vaccine (PNEUMOVAX 23) 25 MCG/0.5ML injection Inject 0.571mIM once  . pravastatin (PRAVACHOL) 40 MG tablet TAKE 1 TABLET BY MOUTH EVERYDAY AT BEDTIME  . promethazine (PHENERGAN) 12.5 MG tablet Take 12.5 mg by mouth 2 (two) times daily as needed for nausea or vomiting.  . ramipril (ALTACE) 10 MG capsule Take 10 mg by mouth daily.  . Marland Kitchenriamcinolone cream (KENALOG) 0.1 % Apply 1 application topically as needed.   No facility-administered encounter medications on file as of 08/06/2018.     Surgical  History: History reviewed. No pertinent surgical history.  Medical History: Past Medical History:  Diagnosis Date  . Diabetes mellitus without complication ([place])   . Hypertension   . Renal disorder   . Seizures ([place])     Family History: History reviewed. No pertinent family history.  Social History   Socioeconomic History  . Marital status: Single    Spouse name: Not on file  . Number of children: Not on file  . Years of education: Not  on file  . Highest education level: Not on file  Occupational History  . Not on file  Social Needs  . Financial resource strain: Not on file  . Food insecurity:    Worry: Not on file    Inability: Not on file  . Transportation needs:    Medical: Not on file    Non-medical: Not on file  Tobacco Use  . Smoking status: Former Research scientist (life sciences)  . Smokeless tobacco: Never Used  . Tobacco comment: quit 25 years ago  Substance and Sexual Activity  . Alcohol use: No    Frequency: Never  . Drug use: No  . Sexual activity: Not Currently  Lifestyle  . Physical activity:    Days per week: Not on file    Minutes per session: Not on file  . Stress: Not on file  Relationships  . Social connections:    Talks on phone: Not on file    Gets together: Not on file    Attends religious service: Not on file    Active member of club or organization: Not on file    Attends meetings of clubs or organizations: Not on file    Relationship status: Not on file  . Intimate partner violence:    Fear of current or ex partner: Not on file    Emotionally abused: Not on file    Physically abused: Not on file    Forced sexual activity: Not on file  Other Topics Concern  . Not on file  Social History Narrative  . Not on file      Review of Systems  Constitutional: Negative for activity change, fatigue and unexpected weight change.  HENT: Negative for congestion, postnasal drip, rhinorrhea and sinus pressure.   Eyes: Positive for blurred vision.  Respiratory: Negative for chest tightness, shortness of breath and wheezing.   Cardiovascular: Negative for chest pain and palpitations.       Blood pressure much improved .  Gastrointestinal: Negative for abdominal pain, constipation, diarrhea and vomiting.  Endocrine: Positive for heat intolerance.       States that his bloodsugars are generally running around 140 Thyroid enlarged  Genitourinary: Negative.   Musculoskeletal: Positive for arthralgias, back pain,  myalgias and neck pain.  Skin: Negative for rash.  Allergic/Immunologic: Negative for environmental allergies.  Neurological: Positive for seizures and headaches.  Hematological: Negative for adenopathy.  Psychiatric/Behavioral: Positive for agitation. The patient is nervous/anxious.     Today's Vitals   08/06/18 1109  BP: 127/79  Pulse: 72  Resp: 16  SpO2: 97%  Weight: 212 lb (96.2 kg)  Height: 5' 11"  (1.803 m)    Physical Exam  Constitutional: He is oriented to person, place, and time. He appears well-developed and well-nourished.  HENT:  Head: Normocephalic and atraumatic.  Nose: Nose normal.  Mouth/Throat: Oropharynx is clear and moist.  Eyes: Pupils are equal, round, and reactive to light. Conjunctivae and EOM are normal.  Neck: Normal range of motion. Neck supple. No JVD present. Carotid  bruit is not present. No tracheal deviation present. No thyromegaly present.  Cardiovascular: Normal rate, regular rhythm and normal heart sounds.  Pulmonary/Chest: Effort normal and breath sounds normal.  Abdominal: Soft. Bowel sounds are normal. He exhibits no distension. There is no tenderness.  Lymphadenopathy:    He has no cervical adenopathy.  Neurological: He is alert and oriented to person, place, and time. No cranial nerve deficit or sensory deficit. He exhibits normal muscle tone.  Skin: Skin is warm and dry. Capillary refill takes less than 2 seconds. No rash noted.  Psychiatric: His speech is normal and behavior is normal. Judgment and thought content normal. His mood appears anxious. Cognition and memory are normal.  Nursing note and vitals reviewed.  Assessment/Plan:  1. Essential (primary) hypertension Improved. Continue with added HCTZ 12.53m tablets along with toprol and ramipril.   2. Type 2 diabetes mellitus with hyperglycemia, unspecified whether long term insulin use ([place] Continue diabetic medications as prescribed .  General Counseling: Jrayman petrosian understanding of the findings of todays visit and agrees with plan of treatment. I have discussed any further diagnostic evaluation that may be needed or ordered today. We also reviewed his medications today. he has been encouraged to call the office with any questions or concerns that should arise related to todays visit.   Hypertension Counseling:   The following hypertensive lifestyle modification were recommended and discussed:  1. Limiting alcohol intake to less than 1 oz/day of ethanol:(24 oz of beer or 8 oz of wine or 2 oz of 100-proof whiskey). 2. Take baby ASA 81 mg daily. 3. Importance of regular aerobic exercise and losing weight. 4. Reduce dietary saturated fat and cholesterol intake for overall cardiovascular health. 5. Maintaining adequate dietary potassium, calcium, and magnesium intake. 6. Regular monitoring of the blood pressure. 7. Reduce sodium intake to less than 100 mmol/day (less than 2.3 gm of sodium or less than 6 gm of sodium choride)   This patient [place]vera Guise[place] a part of collaborative care agreement  Time spent: 115Minutes      Dr FLavera GuiseInternal medicine

## 2018-08-11 ENCOUNTER — Other Ambulatory Visit: Payer: Self-pay

## 2018-08-11 DIAGNOSIS — E1165 Type 2 diabetes mellitus with hyperglycemia: Secondary | ICD-10-CM

## 2018-08-11 MED ORDER — GLIMEPIRIDE 4 MG PO TABS: 4.0000 mg | ORAL_TABLET | Freq: Two times a day (BID) | ORAL | 3 refills | Status: DC

## 2018-08-19 ENCOUNTER — Encounter: Payer: Self-pay | Admitting: Adult Health

## 2018-08-20 ENCOUNTER — Encounter: Payer: Self-pay | Admitting: Adult Health

## 2018-08-20 ENCOUNTER — Ambulatory Visit: Payer: Medicaid Other | Admitting: Adult Health

## 2018-08-20 VITALS — BP 128/82 | HR 83 | Resp 16 | Ht 71.0 in | Wt 211.4 lb

## 2018-08-20 DIAGNOSIS — Z23 Encounter for immunization: Secondary | ICD-10-CM | POA: Diagnosis not present

## 2018-08-20 DIAGNOSIS — J4520 Mild intermittent asthma, uncomplicated: Secondary | ICD-10-CM

## 2018-08-20 DIAGNOSIS — I10 Essential (primary) hypertension: Secondary | ICD-10-CM | POA: Diagnosis not present

## 2018-08-20 DIAGNOSIS — Z1159 Encounter for screening for other viral diseases: Secondary | ICD-10-CM

## 2018-08-20 DIAGNOSIS — E782 Mixed hyperlipidemia: Secondary | ICD-10-CM | POA: Diagnosis not present

## 2018-08-20 DIAGNOSIS — E1165 Type 2 diabetes mellitus with hyperglycemia: Secondary | ICD-10-CM | POA: Diagnosis not present

## 2018-08-20 DIAGNOSIS — Z0001 Encounter for general adult medical examination with abnormal findings: Secondary | ICD-10-CM | POA: Diagnosis not present

## 2018-08-20 DIAGNOSIS — R300 Dysuria: Secondary | ICD-10-CM

## 2018-08-20 DIAGNOSIS — Z114 Encounter for screening for human immunodeficiency virus [HIV]: Secondary | ICD-10-CM

## 2018-08-20 NOTE — Progress Notes (Addendum)
[facility] [place], [place] [address]  Internal MEDICINE  Office Visit Note  Patient Name: Joseph Stark  [id number]  [id number]  Date of Service: 08/20/2018  Chief Complaint  Patient presents with  . annual wellness visit  . Diabetes  . Hypertension  . Hyperlipidemia  . Gastroesophageal Reflux   HPI Pt is here for routine health maintenance examination.  He has a history of DM2, HTN, HLD, GERD and asthma.  Pt generally healthy.  He was recently seen for HTN and placed on HCTZ with good results.  BP today is WNL. He reports he is attempting to pass a kidney stone at this time. He sees urology for this.  He denies using tobacco, drinking alcohol or illicit drugs.    Current Medication: Outpatient Encounter Medications as of 08/20/2018  Medication Sig  . ACCU-CHEK SOFTCLIX LANCETS lancets USE AS DIRECTED TO CHECK BLOOD SUGARS THREE TIMES A DAY DX CODE E11.9  . beclomethasone (QVAR) 40 MCG/ACT inhaler Inhale 2 puffs into the lungs 2 (two) times daily.  Marland Kitchen BYDUREON 2 MG PEN USE 2 MG PEN INJECTED [place] ONCE WEEKLY. TO REPLACE BYETTA  . fenofibrate (TRICOR) 145 MG tablet Take 145 mg by mouth daily.  . fexofenadine (ALLEGRA) 180 MG tablet Take 180 mg by mouth daily.  Marland Kitchen gabapentin (NEURONTIN) 300 MG capsule Take 300 mg by mouth 3 (three) times daily. Take 1 cap every morning,midday, and 2 at bedtime.  Marland Kitchen glimepiride (AMARYL) 4 MG tablet Take 1 tablet (4 mg total) by mouth 2 (two) times daily.  Marland Kitchen glucose blood test strip 1 each by Other route 3 (three) times daily. Use as instructed  . hydrochlorothiazide (HYDRODIURIL) 12.5 MG tablet Take 1 tablet (12.5 mg total) by mouth daily.  . Lancets Misc. (ACCU-CHEK SOFTCLIX LANCET DEV) KIT by Does not apply route.  . meloxicam (MOBIC) 7.5 MG tablet Take 1 tablet (7.5 mg total) by mouth 2 (two) times daily.  . metFORMIN (GLUCOPHAGE) 1000 MG tablet Take 1 tablet po QAM and take 1/2 tablet po QPM  . metoprolol succinate  (TOPROL-XL) 100 MG 24 hr tablet TAKE 1 TABLET BY MOUTH EVERY DAY  . morphine (MSIR) 30 MG tablet Take 30 mg by mouth every 12 (twelve) hours.  Marland Kitchen nystatin-triamcinolone (MYCOLOG II) cream Apply 1 application topically 2 (two) times daily.  Marland Kitchen omeprazole (PRILOSEC) 40 MG capsule Take 40 mg by mouth daily.  . pneumococcal 23 valent vaccine (PNEUMOVAX 23) 25 MCG/0.5ML injection Inject 0.44m IM once  . pravastatin (PRAVACHOL) 40 MG tablet TAKE 1 TABLET BY MOUTH EVERYDAY AT BEDTIME  . promethazine (PHENERGAN) 12.5 MG tablet Take 12.5 mg by mouth 2 (two) times daily as needed for nausea or vomiting.  . ramipril (ALTACE) 10 MG capsule Take 10 mg by mouth daily.  .Marland Kitchentriamcinolone cream (KENALOG) 0.1 % Apply 1 application topically as needed.   No facility-administered encounter medications on file as of 08/20/2018.     Surgical History: History reviewed. No pertinent surgical history.  Medical History: Past Medical History:  Diagnosis Date  . Diabetes mellitus without complication ([place]   . Hypertension   . Renal disorder   . Seizures ([place]     Family History: Family History  Problem Relation Age of Onset  . Ovarian cancer Mother   . Lymphoma Father     Review of Systems  Constitutional: Negative for chills, fatigue and unexpected weight change.  HENT: Positive for postnasal drip. Negative for congestion, rhinorrhea, sneezing and sore  throat.   Eyes: Negative for redness.  Respiratory: Negative for cough, chest tightness and shortness of breath.   Cardiovascular: Negative for chest pain and palpitations.  Gastrointestinal: Negative for abdominal pain, constipation, diarrhea, nausea and vomiting.  Genitourinary: Negative for dysuria and frequency.  Musculoskeletal: Negative for arthralgias, back pain, joint swelling and neck pain.  Skin: Negative for rash.  Neurological: Negative.  Negative for tremors and numbness.  Hematological: Negative for adenopathy. Does not bruise/bleed easily.   Psychiatric/Behavioral: Negative for behavioral problems (Depression), sleep disturbance and suicidal ideas. The patient is not nervous/anxious.    Vital Signs: BP 128/82   Pulse 83   Resp 16   Ht _0  (1.803 m)   Wt 211 lb 6.4 oz (95.9 kg)   SpO2 96%   BMI 29.48 kg/m    Physical Exam  Constitutional: He appears well-developed and well-nourished. No distress.  HENT:  Head: Normocephalic and atraumatic.  Right Ear: External ear normal.  Left Ear: External ear normal.  Nose: Nose normal.  Mouth/Throat: Oropharynx is clear and moist. No oropharyngeal exudate.  Eyes: Pupils are equal, round, and reactive to light. Conjunctivae and EOM are normal. Right eye exhibits no discharge. Left eye exhibits no discharge. No scleral icterus.  Neck: Normal range of motion. Neck supple. No JVD present. No tracheal deviation present. No thyromegaly present.  Cardiovascular: Normal rate, regular rhythm, normal heart sounds and intact distal pulses. Exam reveals no gallop and no friction rub.  No murmur heard. Pulmonary/Chest: Effort normal and breath sounds normal. No stridor. No respiratory distress. He has no wheezes. He has no rales. He exhibits no tenderness.  Abdominal: Soft. Bowel sounds are normal. He exhibits no distension and no mass. There is no tenderness. There is no rebound and no guarding.  Musculoskeletal: Normal range of motion. He exhibits no edema, tenderness or deformity.  Lymphadenopathy:    He has no cervical adenopathy.  Neurological: He is alert. He has normal reflexes. No cranial nerve deficit. He exhibits normal muscle tone. Coordination normal.  Skin: Skin is warm and dry. No rash noted. He is not diaphoretic. No erythema. No pallor.  Psychiatric: He has a normal mood and affect. His behavior is normal. Judgment and thought content normal.   LABS: Recent Results (from the past 2160 hour(s))  POCT HgB A1C     Status: Abnormal   Collection Time: 07/01/18 11:00 AM  Result  Value Ref Range   Hemoglobin A1C 8.2 (A) 4.0 - 5.6 %   HbA1c POC (<> result, manual entry)  4.0 - 5.6 %   HbA1c, POC (prediabetic range)  5.7 - 6.4 %   HbA1c, POC (controlled diabetic range)  0.0 - 7.0 %    Assessment/Plan: 1. Encounter for general adult medical examination with abnormal findings Draw labs and treat accordingly. - CBC with Differential/Platelet - Lipid Panel With LDL/HDL Ratio - TSH - T4, free - Comprehensive metabolic panel - PSA  2. Essential (primary) hypertension Stable, continue current medications. Hypertension Counseling:   The following hypertensive lifestyle modification were recommended and discussed:  1. Limiting alcohol intake to less than 1 oz/day of ethanol:(24 oz of beer or 8 oz of wine or 2 oz of 100-proof whiskey). 2. Take baby ASA 81 mg daily. 3. Importance of regular aerobic exercise and losing weight. 4. Reduce dietary saturated fat and cholesterol intake for overall cardiovascular health. 5. Maintaining adequate dietary potassium, calcium, and magnesium intake. 6. Regular monitoring of the blood pressure. 7. Reduce sodium intake to  less than 100 mmol/day (less than 2.3 gm of sodium or less than 6 gm of sodium choride)   3. Mixed hyperlipidemia Will get labs and recheck HLD.  4. Uncontrolled type 2 diabetes mellitus with hyperglycemia (HCC) Last A1c 8.2.  He is being managed for this.    5. Mild intermittent asthma, uncomplicated Currently doing well.  Reports difficulty when out in the heat. Using inhaler as needed.  6. Flu vaccine need - Flu Vaccine MDCK QUAD PF  7. Dysuria - UA/M w/rflx Culture, Routine  8. Screening for HIV without presence of risk factors - HIV antibody (with reflex)  9. Encounter for hepatitis C screening test for low risk patient - Hepatitis c antibody (reflex)  General Counseling: Jon Gills understanding of the findings of todays visit and agrees with plan of treatment. I have discussed any  further diagnostic evaluation that may be needed or ordered today. We also reviewed his medications today. he has been encouraged to call the office with any questions or concerns that should arise related to todays visit.   Orders Placed This Encounter  Procedures  . Flu Vaccine MDCK QUAD PF  . UA/M w/rflx Culture, Routine     Time spent: 30 Minutes   This patient was seen by Orson Gear AGNP-C in Collaboration with Dr Lavera Guise as a part of collaborative care agreement   Lavera Guise, MD  Internal Medicine

## 2018-08-20 NOTE — Patient Instructions (Signed)

## 2018-08-25 ENCOUNTER — Other Ambulatory Visit: Payer: Self-pay | Admitting: Internal Medicine

## 2018-09-01 DIAGNOSIS — Z23 Encounter for immunization: Secondary | ICD-10-CM | POA: Diagnosis not present

## 2018-09-10 ENCOUNTER — Other Ambulatory Visit: Payer: Self-pay

## 2018-09-10 MED ORDER — METOPROLOL SUCCINATE ER 100 MG PO TB24: 100.0000 mg | ORAL_TABLET | Freq: Every day | ORAL | 6 refills | Status: DC

## 2018-10-13 ENCOUNTER — Other Ambulatory Visit: Payer: Self-pay | Admitting: Adult Health

## 2018-10-13 ENCOUNTER — Other Ambulatory Visit: Payer: Self-pay | Admitting: Internal Medicine

## 2018-10-13 MED ORDER — PRAVASTATIN SODIUM 40 MG PO TABS: ORAL_TABLET | ORAL | 6 refills | Status: DC

## 2018-11-10 ENCOUNTER — Other Ambulatory Visit: Payer: Self-pay | Admitting: Internal Medicine

## 2018-11-10 DIAGNOSIS — I10 Essential (primary) hypertension: Secondary | ICD-10-CM

## 2018-11-10 MED ORDER — HYDROCHLOROTHIAZIDE 12.5 MG PO TABS: 12.5000 mg | ORAL_TABLET | Freq: Every day | ORAL | 3 refills | Status: DC

## 2018-11-20 ENCOUNTER — Ambulatory Visit: Payer: Medicaid Other | Admitting: Nurse Practitioner

## 2018-11-20 ENCOUNTER — Encounter: Payer: Self-pay | Admitting: Nurse Practitioner

## 2018-11-20 VITALS — BP 135/77 | HR 75 | Resp 16 | Ht 71.0 in | Wt 210.0 lb

## 2018-11-20 DIAGNOSIS — M150 Primary generalized (osteo)arthritis: Secondary | ICD-10-CM | POA: Diagnosis not present

## 2018-11-20 DIAGNOSIS — M25561 Pain in right knee: Secondary | ICD-10-CM | POA: Diagnosis not present

## 2018-11-20 DIAGNOSIS — T148XXA Other injury of unspecified body region, initial encounter: Secondary | ICD-10-CM | POA: Insufficient documentation

## 2018-11-20 DIAGNOSIS — I10 Essential (primary) hypertension: Secondary | ICD-10-CM

## 2018-11-20 DIAGNOSIS — E1165 Type 2 diabetes mellitus with hyperglycemia: Secondary | ICD-10-CM

## 2018-11-20 LAB — POCT GLYCOSYLATED HEMOGLOBIN (HGB A1C): Hemoglobin A1C: 8.2 % — AB (ref 4.0–5.6)

## 2018-11-20 MED ORDER — MELOXICAM 7.5 MG PO TABS: 7.5000 mg | ORAL_TABLET | Freq: Two times a day (BID) | ORAL | 3 refills | Status: DC

## 2018-11-20 MED ORDER — MUPIROCIN 2 % EX OINT
1.0000 | TOPICAL_OINTMENT | Freq: Two times a day (BID) | CUTANEOUS | 1 refills | Status: DC
Start: 2018-11-20 — End: 2019-02-23

## 2018-11-20 NOTE — Progress Notes (Signed)
[facility] [place], [place] [address]  Internal MEDICINE  Office Visit Note  Patient Name: Joseph Stark  [id number]  [id number]  Date of Service: 11/20/2018  Chief Complaint  Patient presents with  . Hypertension  . Diabetes  . Follow-up    The patient is here for routine follow up visit. He is c/o right knee pain. Feel on 10/15/2018, landing on his right knee. Skin is very red and raw. Was treated with first aid by his neighbor. They cleaned the wound and applied antibiotic ointment and applied a clean bandage. At first, the pain in the knee was so bad, he was unable to walk for a few days. Rested, iced, and elevated the leg. Has gradually gotten better. Able to walk better. No heat or drainage currently present.  Blood sugars are stable. HgbA1c continues to be 8.2. Takes medication as prescribed. Blood pressure is well controlled       Current Medication: Outpatient Encounter Medications as of 11/20/2018  Medication Sig  . ACCU-CHEK AVIVA PLUS test strip CHECK BLOOD SUGARS 3 TIMES A DAY, DX- UNCONTROLLED DIABETES  . ACCU-CHEK SOFTCLIX LANCETS lancets USE AS DIRECTED TO CHECK BLOOD SUGARS THREE TIMES A DAY DX CODE E11.9  . beclomethasone (QVAR) 40 MCG/ACT inhaler Inhale 2 puffs into the lungs 2 (two) times daily.  Marland Kitchen BYDUREON 2 MG PEN USE 2 MG PEN INJECTED [place] ONCE WEEKLY. TO REPLACE BYETTA  . fenofibrate (TRICOR) 145 MG tablet Take 145 mg by mouth daily.  . fexofenadine (ALLEGRA) 180 MG tablet Take 180 mg by mouth daily.  Marland Kitchen gabapentin (NEURONTIN) 300 MG capsule TAKE ONE CAPSULE BY MOUTH EVERY MORNING, 1 AT MIDDAY AND 2 AT AT BEDTIME  . glimepiride (AMARYL) 4 MG tablet Take 1 tablet (4 mg total) by mouth 2 (two) times daily.  . hydrochlorothiazide (HYDRODIURIL) 12.5 MG tablet Take 1 tablet (12.5 mg total) by mouth daily.  . Lancets Misc. (ACCU-CHEK SOFTCLIX LANCET DEV) KIT by Does not apply route.  . meloxicam (MOBIC) 7.5 MG tablet Take 1 tablet  (7.5 mg total) by mouth 2 (two) times daily.  . metFORMIN (GLUCOPHAGE) 1000 MG tablet Take 1 tablet po QAM and take 1/2 tablet po QPM  . metoprolol succinate (TOPROL-XL) 100 MG 24 hr tablet Take 1 tablet (100 mg total) by mouth daily. Take with or immediately following a meal.  . morphine (MSIR) 30 MG tablet Take 30 mg by mouth every 12 (twelve) hours.  Marland Kitchen nystatin-triamcinolone (MYCOLOG II) cream Apply 1 application topically 2 (two) times daily.  Marland Kitchen omeprazole (PRILOSEC) 40 MG capsule TAKE ONE CAPSULE DAILY FOR HEARTBURN  . pneumococcal 23 valent vaccine (PNEUMOVAX 23) 25 MCG/0.5ML injection Inject 0.61m IM once  . pravastatin (PRAVACHOL) 40 MG tablet TAKE 1 TABLET BY MOUTH EVERYDAY AT BEDTIME  . promethazine (PHENERGAN) 12.5 MG tablet Take 12.5 mg by mouth 2 (two) times daily as needed for nausea or vomiting.  . ramipril (ALTACE) 10 MG capsule TAKE 1 CAPSULE BY MOUTH EVERY DAY  . triamcinolone cream (KENALOG) 0.1 % Apply 1 application topically as needed.  . [DISCONTINUED] meloxicam (MOBIC) 7.5 MG tablet Take 1 tablet (7.5 mg total) by mouth 2 (two) times daily.  . mupirocin ointment (BACTROBAN) 2 % Apply 1 application topically 2 (two) times daily.   No facility-administered encounter medications on file as of 11/20/2018.     Surgical History: History reviewed. No pertinent surgical history.  Medical History: Past Medical History:  Diagnosis Date  . Diabetes  mellitus without complication ([place])   . Hypertension   . Renal disorder   . Seizures ([place])     Family History: Family History  Problem Relation Age of Onset  . Ovarian cancer Mother   . Lymphoma Father     Social History   Socioeconomic History  . Marital status: Single    Spouse name: Not on file  . Number of children: Not on file  . Years of education: Not on file  . Highest education level: Not on file  Occupational History  . Not on file  Social Needs  . Financial resource strain: Not on file  . Food  insecurity:    Worry: Not on file    Inability: Not on file  . Transportation needs:    Medical: Not on file    Non-medical: Not on file  Tobacco Use  . Smoking status: Former Research scientist (life sciences)  . Smokeless tobacco: Never Used  . Tobacco comment: quit 25 years ago  Substance and Sexual Activity  . Alcohol use: No    Frequency: Never  . Drug use: No  . Sexual activity: Not Currently  Lifestyle  . Physical activity:    Days per week: Not on file    Minutes per session: Not on file  . Stress: Not on file  Relationships  . Social connections:    Talks on phone: Not on file    Gets together: Not on file    Attends religious service: Not on file    Active member of club or organization: Not on file    Attends meetings of clubs or organizations: Not on file    Relationship status: Not on file  . Intimate partner violence:    Fear of current or ex partner: Not on file    Emotionally abused: Not on file    Physically abused: Not on file    Forced sexual activity: Not on file  Other Topics Concern  . Not on file  Social History Narrative  . Not on file      Review of Systems  Constitutional: Negative for activity change, fatigue and unexpected weight change.  HENT: Negative for congestion, postnasal drip, rhinorrhea and sinus pressure.   Respiratory: Negative for chest tightness, shortness of breath and wheezing.   Cardiovascular: Negative for chest pain and palpitations.       Blood pressure much improved .  Gastrointestinal: Negative for abdominal pain, constipation, diarrhea and vomiting.  Endocrine: Positive for heat intolerance. Negative for polydipsia and polyuria.       States that his bloodsugars are generally running around 140 Thyroid enlarged  Genitourinary: Negative.   Musculoskeletal: Positive for arthralgias, back pain, myalgias and neck pain.       Right knee pain with swelling since fall on 10/15/2018. Has reduced his ROM and actiity.   Skin: Negative for rash.        The patient has red, raw abrasion to the knee. Tender   Allergic/Immunologic: Negative for environmental allergies.  Neurological: Positive for seizures and headaches.  Hematological: Negative for adenopathy.  Psychiatric/Behavioral: Positive for agitation. The patient is nervous/anxious.     Today's Vitals   11/20/18 1047  BP: 135/77  Pulse: 75  Resp: 16  SpO2: 94%  Weight: 210 lb (95.3 kg)  Height: _0  (1.803 m)    Physical Exam  Constitutional: He is oriented to person, place, and time. He appears well-developed and well-nourished.  HENT:  Head: Normocephalic and atraumatic.  Nose: Nose normal.  Eyes: Pupils are equal, round, and reactive to light. Conjunctivae and EOM are normal.  Neck: Normal range of motion. Neck supple. No JVD present. Carotid bruit is not present. No tracheal deviation present. No thyromegaly present.  Cardiovascular: Normal rate, regular rhythm and normal heart sounds.  Pulmonary/Chest: Effort normal and breath sounds normal.  Abdominal: Soft. Bowel sounds are normal. He exhibits no distension. There is no tenderness.  Musculoskeletal:  Right knee pain with swelling around the patella. ROm and strength appear intact.   Lymphadenopathy:    He has no cervical adenopathy.  Neurological: He is alert and oriented to person, place, and time. No cranial nerve deficit or sensory deficit. He exhibits normal muscle tone.  Skin: Skin is warm and dry. Capillary refill takes less than 2 seconds. No rash noted.  Abrasion covering the right patella. Red but not warm. No drainage present.   Psychiatric: His speech is normal and behavior is normal. Judgment and thought content normal. His mood appears anxious. Cognition and memory are normal.  Nursing note and vitals reviewed.  Assessment/Plan: 1. Type 2 diabetes mellitus with hyperglycemia, unspecified whether long term insulin use (HCC) - POCT HgB A1C 8.2 today. Has been unable to increase metformin to 187m twice  daily. Continues with 10012min AM and 50071mn pm. conitnues amaryl and bydurean as prescribed.   2. Acute pain of right knee Rest, ice, and elevate the knee when possible. Will x-ray the knee for further evaluation.  - DG Knee Complete 4 Views Right; Future  3. Traumatic abrasion bactroban ointment may be applied to abrasion twice daily. Keep wound clean and dry.  - mupirocin ointment (BACTROBAN) 2 %; Apply 1 application topically 2 (two) times daily.  Dispense: 22 g; Refill: 1  4. Primary generalized (osteo)arthritis - meloxicam (MOBIC) 7.5 MG tablet; Take 1 tablet (7.5 mg total) by mouth 2 (two) times daily.  Dispense: 60 tablet; Refill: 3  5. Essential (primary) hypertension Stable. Continue bp medication as prescribed .  General Counseling: Jefdejion grilloderstanding of the findings of todays visit and agrees with plan of treatment. I have discussed any further diagnostic evaluation that may be needed or ordered today. We also reviewed his medications today. he has been encouraged to call the office with any questions or concerns that should arise related to todays visit.  Apply a compressive ACE bandage. Rest and elevate the affected painful area.  Apply cold compresses intermittently as needed.  As pain recedes, begin normal activities slowly as tolerated.  Call if symptoms persist.  Diabetes Counseling:  1. Addition of ACE inh/ ARB'S for nephroprotection. Microalbumin is updated  2. Diabetic foot care, prevention of complications. Podiatry consult 3. Exercise and lose weight.  4. Diabetic eye examination, Diabetic eye exam is updated  5. Monitor blood sugar closlely. nutrition counseling.  6. Sign and symptoms of hypoglycemia including shaking sweating,confusion and headaches.  This patient was seen by HeaLeretha PolP Collaboration with Dr FozLavera Guise a part of collaborative care agreement  Orders Placed This Encounter  Procedures  . DG Knee Complete 4 Views  Right  . POCT HgB A1C    Meds ordered this encounter  Medications  . meloxicam (MOBIC) 7.5 MG tablet    Sig: Take 1 tablet (7.5 mg total) by mouth 2 (two) times daily.    Dispense:  60 tablet    Refill:  3    Order Specific Question:   Supervising Provider    Answer:   KHAHumphrey Rolls  [place] M [[id number]]  . mupirocin ointment (BACTROBAN) 2 %    Sig: Apply 1 application topically 2 (two) times daily.    Dispense:  22 g    Refill:  1    Order Specific Question:   Supervising Provider    Answer:   Lavera Guise [[id number]]    Time spent: 65 Minutes      Dr Lavera Guise Internal medicine

## 2018-12-11 ENCOUNTER — Other Ambulatory Visit: Payer: Self-pay

## 2018-12-11 DIAGNOSIS — E1165 Type 2 diabetes mellitus with hyperglycemia: Secondary | ICD-10-CM

## 2018-12-11 MED ORDER — GLIMEPIRIDE 4 MG PO TABS: 4.0000 mg | ORAL_TABLET | Freq: Two times a day (BID) | ORAL | 3 refills | Status: DC

## 2018-12-11 MED ORDER — METFORMIN HCL 1000 MG PO TABS: ORAL_TABLET | ORAL | 5 refills | Status: DC

## 2018-12-22 ENCOUNTER — Ambulatory Visit
Admission: RE | Admit: 2018-12-22 | Discharge: 2018-12-22 | Disposition: A | Discharge status: Home / Self Care | Payer: Medicaid Other | Attending: Anesthesiology | Admitting: Anesthesiology

## 2018-12-22 ENCOUNTER — Other Ambulatory Visit: Payer: Self-pay | Admitting: Anesthesiology

## 2018-12-22 ENCOUNTER — Ambulatory Visit
Admission: RE | Admit: 2018-12-22 | Discharge: 2018-12-22 | Disposition: A | Discharge status: Home / Self Care | Payer: Medicaid Other | Source: Ambulatory Visit | Attending: Anesthesiology | Admitting: Anesthesiology

## 2018-12-22 DIAGNOSIS — M1711 Unilateral primary osteoarthritis, right knee: Secondary | ICD-10-CM | POA: Insufficient documentation

## 2018-12-22 DIAGNOSIS — M50321 Other cervical disc degeneration at C4-C5 level: Secondary | ICD-10-CM | POA: Insufficient documentation

## 2018-12-22 DIAGNOSIS — M25511 Pain in right shoulder: Secondary | ICD-10-CM | POA: Insufficient documentation

## 2018-12-22 DIAGNOSIS — Z981 Arthrodesis status: Secondary | ICD-10-CM | POA: Diagnosis not present

## 2018-12-22 DIAGNOSIS — R52 Pain, unspecified: Secondary | ICD-10-CM

## 2018-12-22 DIAGNOSIS — Y92009 Unspecified place in unspecified non-institutional (private) residence as the place of occurrence of the external cause: Secondary | ICD-10-CM | POA: Diagnosis not present

## 2018-12-22 DIAGNOSIS — M25561 Pain in right knee: Secondary | ICD-10-CM | POA: Insufficient documentation

## 2018-12-22 DIAGNOSIS — W19XXXA Unspecified fall, initial encounter: Secondary | ICD-10-CM | POA: Insufficient documentation

## 2019-02-12 ENCOUNTER — Other Ambulatory Visit: Payer: Self-pay

## 2019-02-12 MED ORDER — ACCU-CHEK SOFTCLIX LANCETS MISC: 6 refills | Status: DC

## 2019-02-20 ENCOUNTER — Other Ambulatory Visit: Payer: Self-pay | Admitting: Internal Medicine

## 2019-02-23 ENCOUNTER — Encounter: Payer: Self-pay | Admitting: Nurse Practitioner

## 2019-02-23 ENCOUNTER — Ambulatory Visit: Payer: Medicaid Other | Admitting: Nurse Practitioner

## 2019-02-23 VITALS — BP 134/89 | HR 84 | Resp 16 | Ht 71.0 in | Wt 210.2 lb

## 2019-02-23 DIAGNOSIS — E1165 Type 2 diabetes mellitus with hyperglycemia: Secondary | ICD-10-CM | POA: Diagnosis not present

## 2019-02-23 DIAGNOSIS — M25541 Pain in joints of right hand: Secondary | ICD-10-CM | POA: Insufficient documentation

## 2019-02-23 DIAGNOSIS — L209 Atopic dermatitis, unspecified: Secondary | ICD-10-CM | POA: Diagnosis not present

## 2019-02-23 DIAGNOSIS — I10 Essential (primary) hypertension: Secondary | ICD-10-CM

## 2019-02-23 LAB — POCT GLYCOSYLATED HEMOGLOBIN (HGB A1C): Hemoglobin A1C: 8.4 % — AB (ref 4.0–5.6)

## 2019-02-23 MED ORDER — CLOBETASOL PROPIONATE 0.05 % EX CREA: 1.0000 "application " | TOPICAL_CREAM | Freq: Two times a day (BID) | CUTANEOUS | 3 refills | Status: DC

## 2019-02-23 MED ORDER — EXENATIDE ER 2 MG ~~LOC~~ PEN: 2.0000 mg | PEN_INJECTOR | SUBCUTANEOUS | 5 refills | Status: DC

## 2019-02-23 NOTE — Progress Notes (Signed)
[facility] [place], [place] [address]  Internal MEDICINE  Office Visit Note  Patient Name: Joseph Stark  [id number]  [id number]  Date of Service: 02/23/2019  Chief Complaint  Patient presents with  . Follow-up  . Diabetes  . Results    xray     He is c/o right knee pain. Feel on 10/15/2018, landing on his right knee. Had x-ray of right knee. Does show some mild compartment osteoarthritis in the right knee, but was without acute abnormalities. Right shoulder was x-rayed and was also without abnormalities. He also had x-ray of ceervical spine. Did show mild degenerative disc disease at C4/C5 and C6/C7 level. He also had cervical fusion of C5/C6. Now having right hand pain. Did use his hand to block his fall. Is swollen. He is unable to make a fist with the right hand.  Continues to have to have problems with very dry and rough skin on on bilateral lower legs. Has tried multiple topical remedies, triamcinolone 0.1% cream, as well as mupiricin ointment which has not helped at all.  Blood sugars are stable. HgbA1c continues to be 8.2. Takes medication as prescribed. Blood pressure is well controlled         Current Medication: Outpatient Encounter Medications as of 02/23/2019  Medication Sig Note  . ACCU-CHEK AVIVA PLUS test strip CHECK BLOOD SUGARS 3 TIMES A DAY, DX- UNCONTROLLED DIABETES   . ACCU-CHEK SOFTCLIX LANCETS lancets USE AS DIRECTED TO CHECK BLOOD SUGARS THREE TIMES A DAY DX CODE E11.9   . beclomethasone (QVAR) 40 MCG/ACT inhaler Inhale 2 puffs into the lungs 2 (two) times daily.   . Exenatide ER (BYDUREON) 2 MG PEN Inject 2 mg into the skin once a week.   . fenofibrate (TRICOR) 145 MG tablet Take 145 mg by mouth daily.   . fexofenadine (ALLEGRA) 180 MG tablet Take 180 mg by mouth daily.   Marland Kitchen gabapentin (NEURONTIN) 300 MG capsule TAKE ONE CAPSULE BY MOUTH EVERY MORNING, 1 AT MIDDAY AND 2 AT AT BEDTIME   . glimepiride (AMARYL) 4 MG tablet Take 1  tablet (4 mg total) by mouth 2 (two) times daily.   . hydrochlorothiazide (HYDRODIURIL) 12.5 MG tablet Take 1 tablet (12.5 mg total) by mouth daily.   . Lancets Misc. (ACCU-CHEK SOFTCLIX LANCET DEV) KIT by Does not apply route.   . meloxicam (MOBIC) 7.5 MG tablet Take 1 tablet (7.5 mg total) by mouth 2 (two) times daily.   . metFORMIN (GLUCOPHAGE) 1000 MG tablet Take 1 tablet po QAM and take 1/2 tablet po QPM   . metoprolol succinate (TOPROL-XL) 100 MG 24 hr tablet Take 1 tablet (100 mg total) by mouth daily. Take with or immediately following a meal.   . morphine (MSIR) 30 MG tablet Take 30 mg by mouth every 12 (twelve) hours.   Marland Kitchen nystatin-triamcinolone (MYCOLOG II) cream Apply 1 application topically 2 (two) times daily.   Marland Kitchen omeprazole (PRILOSEC) 40 MG capsule TAKE ONE CAPSULE DAILY FOR HEARTBURN   . pneumococcal 23 valent vaccine (PNEUMOVAX 23) 25 MCG/0.5ML injection Inject 0.21m IM once   . pravastatin (PRAVACHOL) 40 MG tablet TAKE 1 TABLET BY MOUTH EVERYDAY AT BEDTIME   . promethazine (PHENERGAN) 12.5 MG tablet Take 12.5 mg by mouth 2 (two) times daily as needed for nausea or vomiting.   . ramipril (ALTACE) 10 MG capsule TAKE 1 CAPSULE BY MOUTH EVERY DAY   . [DISCONTINUED] BYDUREON 2 MG PEN INJECT 2MG (1 PEN) SUBCUTANEOUSLY ONCE  WEEKLY. TO REPLACE BYETTA   . [DISCONTINUED] mupirocin ointment (BACTROBAN) 2 % Apply 1 application topically 2 (two) times daily.   . [DISCONTINUED] triamcinolone cream (KENALOG) 0.1 % Apply 1 application topically as needed. 02/23/2019: try clobetasol cream  . clobetasol cream (TEMOVATE) 2.70 % Apply 1 application topically 2 (two) times daily.    No facility-administered encounter medications on file as of 02/23/2019.     Surgical History: History reviewed. No pertinent surgical history.  Medical History: Past Medical History:  Diagnosis Date  . Diabetes mellitus without complication ([place])   . Hypertension   . Renal disorder   . Seizures ([place])     Family  History: Family History  Problem Relation Age of Onset  . Ovarian cancer Mother   . Lymphoma Father     Social History   Socioeconomic History  . Marital status: Single    Spouse name: Not on file  . Number of children: Not on file  . Years of education: Not on file  . Highest education level: Not on file  Occupational History  . Not on file  Social Needs  . Financial resource strain: Not on file  . Food insecurity:    Worry: Not on file    Inability: Not on file  . Transportation needs:    Medical: Not on file    Non-medical: Not on file  Tobacco Use  . Smoking status: Former Research scientist (life sciences)  . Smokeless tobacco: Never Used  . Tobacco comment: quit 30 years ago  Substance and Sexual Activity  . Alcohol use: No    Frequency: Never  . Drug use: No  . Sexual activity: Not Currently  Lifestyle  . Physical activity:    Days per week: Not on file    Minutes per session: Not on file  . Stress: Not on file  Relationships  . Social connections:    Talks on phone: Not on file    Gets together: Not on file    Attends religious service: Not on file    Active member of club or organization: Not on file    Attends meetings of clubs or organizations: Not on file    Relationship status: Not on file  . Intimate partner violence:    Fear of current or ex partner: Not on file    Emotionally abused: Not on file    Physically abused: Not on file    Forced sexual activity: Not on file  Other Topics Concern  . Not on file  Social History Narrative  . Not on file      Review of Systems  Constitutional: Negative for activity change, fatigue and unexpected weight change.  HENT: Negative for congestion, postnasal drip, rhinorrhea and sinus pressure.   Respiratory: Negative for chest tightness, shortness of breath and wheezing.   Cardiovascular: Negative for chest pain and palpitations.       Blood pressure much improved .  Gastrointestinal: Negative for abdominal pain, constipation,  diarrhea and vomiting.  Endocrine: Negative for polydipsia and polyuria.       States that his bloodsugars are generally running around 140 Thyroid enlarged  Musculoskeletal: Positive for arthralgias, back pain, myalgias and neck pain.       Right knee pain with swelling since fall on 10/15/2018. Has reduced his ROM and actiity.   Skin: Negative for rash.       Rough and dry skin on bilateral lower extremities. States it's been like this for years. States he was told this  was just due to diabetes. Has been given a few different creams for this, but nothing has worked.   Allergic/Immunologic: Negative for environmental allergies.  Neurological: Positive for seizures and headaches.  Hematological: Negative for adenopathy.  Psychiatric/Behavioral: Positive for agitation. The patient is nervous/anxious.     Today's Vitals   02/23/19 1050  BP: 134/89  Pulse: 84  Resp: 16  SpO2: 94%  Weight: 210 lb 3.2 oz (95.3 kg)  Height: _0  (1.803 m)   Body mass index is 29.32 kg/m.  Physical Exam Vitals signs and nursing note reviewed.  Constitutional:      Appearance: Normal appearance. He is well-developed.  HENT:     Head: Normocephalic and atraumatic.     Nose: Nose normal.  Eyes:     Conjunctiva/sclera: Conjunctivae normal.     Pupils: Pupils are equal, round, and reactive to light.  Neck:     Musculoskeletal: Normal range of motion and neck supple.     Thyroid: No thyromegaly.     Vascular: No carotid bruit or JVD.     Trachea: No tracheal deviation.  Cardiovascular:     Rate and Rhythm: Normal rate and regular rhythm.     Heart sounds: Normal heart sounds.  Pulmonary:     Effort: Pulmonary effort is normal.     Breath sounds: Normal breath sounds.  Abdominal:     General: Bowel sounds are normal. There is no distension.     Palpations: Abdomen is soft.     Tenderness: There is no abdominal tenderness.  Musculoskeletal:     Comments: Tenderness with mild swelling of the  right hand. Decreased strength and ROM present.   Lymphadenopathy:     Cervical: No cervical adenopathy.  Skin:    General: Skin is warm and dry.     Capillary Refill: Capillary refill takes less than 2 seconds.     Findings: No rash.     Comments: Rough, dry, scaly appearing skin on bilateral lower legs. Some areas scabbed over.   Neurological:     Mental Status: He is alert and oriented to person, place, and time. Mental status is at baseline.     Cranial Nerves: No cranial nerve deficit.     Sensory: No sensory deficit.     Motor: No abnormal muscle tone.  Psychiatric:        Mood and Affect: Mood is anxious.        Speech: Speech normal.        Behavior: Behavior normal.        Thought Content: Thought content normal.        Judgment: Judgment normal.   Assessment/Plan: 1. Type 2 diabetes mellitus with hyperglycemia, unspecified whether long term insulin use (HCC) - POCT HgB A1C 8.4 today. Patient has been out of bydurean. New rx sent to pharmacy 02/16/2019. prescription resent today. Sample provided. Continue other diabetic medication as prescribed.  - Exenatide ER (BYDUREON) 2 MG PEN; Inject 2 mg into the skin once a week.  Dispense: 4 each; Refill: 5  2. Atopic dermatitis, unspecified type Will try clobetasol emmollient cream. Apply to affected areas of skin twice daily. Refer to dermatology if no better at next follow up visit.  - clobetasol cream (TEMOVATE) 0.05 %; Apply 1 application topically 2 (two) times daily.  Dispense: 60 g; Refill: 3  3. Essential (primary) hypertension Stable. Continue bp medication as prescribed .  4. Pain in joints of right hand Slightly decreased ROM. Offered x-ray  of hand, but patient wishes to wait on this for now. Recommended ice and compression. Pain medication as needed and as already prescribed .  General Counseling: burle kwan understanding of the findings of todays visit and agrees with plan of treatment. I have discussed any  further diagnostic evaluation that may be needed or ordered today. We also reviewed his medications today. he has been encouraged to call the office with any questions or concerns that should arise related to todays visit.  Diabetes Counseling:  1. Addition of ACE inh/ ARB'S for nephroprotection. Microalbumin is updated  2. Diabetic foot care, prevention of complications. Podiatry consult 3. Exercise and lose weight.  4. Diabetic eye examination, Diabetic eye exam is updated  5. Monitor blood sugar closlely. nutrition counseling.  6. Sign and symptoms of hypoglycemia including shaking sweating,confusion and headaches.  This patient was seen by Leretha Pol FNP Collaboration with Dr Lavera Guise as a part of collaborative care agreement  Orders Placed This Encounter  Procedures  . POCT HgB A1C    Meds ordered this encounter  Medications  . clobetasol cream (TEMOVATE) 0.05 %    Sig: Apply 1 application topically 2 (two) times daily.    Dispense:  60 g    Refill:  3    Order Specific Question:   Supervising Provider    Answer:   Lavera Guise [[id number]]  . Exenatide ER (BYDUREON) 2 MG PEN    Sig: Inject 2 mg into the skin once a week.    Dispense:  4 each    Refill:  5    NEW RX.    Order Specific Question:   Supervising Provider    Answer:   Lavera Guise [[id number]]    Time spent: 12 Minutes      Dr Lavera Guise Internal medicine

## 2019-03-13 ENCOUNTER — Other Ambulatory Visit: Payer: Self-pay

## 2019-03-13 ENCOUNTER — Other Ambulatory Visit: Payer: Self-pay | Admitting: Adult Health

## 2019-03-13 DIAGNOSIS — I10 Essential (primary) hypertension: Secondary | ICD-10-CM

## 2019-03-13 DIAGNOSIS — M150 Primary generalized (osteo)arthritis: Secondary | ICD-10-CM

## 2019-03-13 MED ORDER — MELOXICAM 7.5 MG PO TABS: 7.5000 mg | ORAL_TABLET | Freq: Two times a day (BID) | ORAL | 3 refills | Status: DC

## 2019-04-13 ENCOUNTER — Other Ambulatory Visit: Payer: Self-pay

## 2019-04-13 DIAGNOSIS — E1165 Type 2 diabetes mellitus with hyperglycemia: Secondary | ICD-10-CM

## 2019-04-13 MED ORDER — GLIMEPIRIDE 4 MG PO TABS: 4.0000 mg | ORAL_TABLET | Freq: Two times a day (BID) | ORAL | 3 refills | Status: DC

## 2019-04-21 ENCOUNTER — Other Ambulatory Visit: Payer: Self-pay

## 2019-04-21 MED ORDER — METOPROLOL SUCCINATE ER 100 MG PO TB24: 100.0000 mg | ORAL_TABLET | Freq: Every day | ORAL | 6 refills | Status: DC

## 2019-05-13 ENCOUNTER — Other Ambulatory Visit: Payer: Self-pay

## 2019-05-13 DIAGNOSIS — E1165 Type 2 diabetes mellitus with hyperglycemia: Secondary | ICD-10-CM

## 2019-05-13 MED ORDER — GLUCOSE BLOOD VI STRP: ORAL_STRIP | 6 refills | Status: DC

## 2019-06-01 ENCOUNTER — Ambulatory Visit: Payer: Self-pay | Admitting: Nurse Practitioner

## 2019-06-11 ENCOUNTER — Other Ambulatory Visit: Payer: Self-pay | Admitting: Adult Health

## 2019-06-19 ENCOUNTER — Other Ambulatory Visit: Payer: Self-pay | Admitting: Adult Health

## 2019-06-25 ENCOUNTER — Other Ambulatory Visit: Payer: Self-pay

## 2019-06-25 ENCOUNTER — Encounter: Payer: Self-pay | Admitting: Nurse Practitioner

## 2019-06-25 ENCOUNTER — Ambulatory Visit: Payer: Medicaid Other | Admitting: Nurse Practitioner

## 2019-06-25 VITALS — BP 135/77 | HR 79 | Resp 16 | Ht 71.0 in | Wt 211.0 lb

## 2019-06-25 DIAGNOSIS — L209 Atopic dermatitis, unspecified: Secondary | ICD-10-CM

## 2019-06-25 DIAGNOSIS — I10 Essential (primary) hypertension: Secondary | ICD-10-CM | POA: Diagnosis not present

## 2019-06-25 DIAGNOSIS — R21 Rash and other nonspecific skin eruption: Secondary | ICD-10-CM

## 2019-06-25 DIAGNOSIS — E1165 Type 2 diabetes mellitus with hyperglycemia: Secondary | ICD-10-CM

## 2019-06-25 LAB — POCT GLYCOSYLATED HEMOGLOBIN (HGB A1C): Hemoglobin A1C: 9 % — AB (ref 4.0–5.6)

## 2019-06-25 MED ORDER — OZEMPIC (0.25 OR 0.5 MG/DOSE) 2 MG/1.5ML ~~LOC~~ SOPN: 0.5000 mg | PEN_INJECTOR | SUBCUTANEOUS | 3 refills | Status: DC

## 2019-06-25 NOTE — Progress Notes (Signed)
[facility] [place], [place] [address]  Internal MEDICINE  Office Visit Note  Patient Name: Joseph Stark  [id number]  [id number]  Date of Service: 06/25/2019  Chief Complaint  Patient presents with  . Medical Management of Chronic Issues  . Diabetes  . Hypertension    The patient is here for follow up of diabetes. Blood sugars continue to run high. He states that he is often having trouble with the bydureon injection. Often, the plunger does not work. Makes him feel wary about using the injection again, so he may be skipping doses frequently. He does continue to metformin and glimepiride. He also has non healing skin lesions on his left lower leg. This has been present for a few years. We have tried several different types of topical and oral antibiotics. Does not heal with time and treatment.        Current Medication: Outpatient Encounter Medications as of 06/25/2019  Medication Sig  . ACCU-CHEK SOFTCLIX LANCETS lancets USE AS DIRECTED TO CHECK BLOOD SUGARS THREE TIMES A DAY DX CODE E11.9  . beclomethasone (QVAR) 40 MCG/ACT inhaler Inhale 2 puffs into the lungs 2 (two) times daily.  . clobetasol cream (TEMOVATE) 7.85 % Apply 1 application topically 2 (two) times daily.  . Exenatide ER (BYDUREON) 2 MG PEN Inject 2 mg into the skin once a week.  . fenofibrate (TRICOR) 145 MG tablet Take 145 mg by mouth daily.  . fexofenadine (ALLEGRA) 180 MG tablet Take 180 mg by mouth daily.  Marland Kitchen gabapentin (NEURONTIN) 300 MG capsule TAKE ONE CAPSULE BY MOUTH EVERY MORNING, 1 AT MIDDAY AND 2 AT AT BEDTIME  . glimepiride (AMARYL) 4 MG tablet Take 1 tablet (4 mg total) by mouth 2 (two) times daily.  Marland Kitchen glucose blood (ACCU-CHEK AVIVA PLUS) test strip CHECK BLOOD SUGARS 3 TIMES A DAY, DX- UNCONTROLLED DIABETES  . hydrochlorothiazide (HYDRODIURIL) 12.5 MG tablet TAKE 1 TABLET BY MOUTH EVERY DAY  . Lancets Misc. (ACCU-CHEK SOFTCLIX LANCET DEV) KIT by Does not apply route.  .  meloxicam (MOBIC) 7.5 MG tablet Take 1 tablet (7.5 mg total) by mouth 2 (two) times daily.  . metFORMIN (GLUCOPHAGE) 1000 MG tablet Take 1 tablet po QAM and take 1/2 tablet po QPM  . metoprolol succinate (TOPROL-XL) 100 MG 24 hr tablet Take 1 tablet (100 mg total) by mouth daily. Take with or immediately following a meal.  . morphine (MSIR) 30 MG tablet Take 30 mg by mouth every 12 (twelve) hours.  Marland Kitchen nystatin-triamcinolone (MYCOLOG II) cream Apply 1 application topically 2 (two) times daily.  Marland Kitchen omeprazole (PRILOSEC) 40 MG capsule TAKE ONE CAPSULE DAILY FOR HEARTBURN  . pneumococcal 23 valent vaccine (PNEUMOVAX 23) 25 MCG/0.5ML injection Inject 0.20m IM once  . pravastatin (PRAVACHOL) 40 MG tablet TAKE 1 TABLET BY MOUTH EVERYDAY AT BEDTIME  . promethazine (PHENERGAN) 12.5 MG tablet Take 12.5 mg by mouth 2 (two) times daily as needed for nausea or vomiting.  . ramipril (ALTACE) 10 MG capsule TAKE 1 CAPSULE BY MOUTH EVERY DAY  . Semaglutide,0.25 or 0.5MG/DOS, (OZEMPIC, 0.25 OR 0.5 MG/DOSE,) 2 MG/1.5ML SOPN Inject 0.5 mg into the skin once a week.   No facility-administered encounter medications on file as of 06/25/2019.     Surgical History: History reviewed. No pertinent surgical history.  Medical History: Past Medical History:  Diagnosis Date  . Diabetes mellitus without complication ([place]   . Hypertension   . Renal disorder   . Seizures ([place]  Family History: Family History  Problem Relation Age of Onset  . Ovarian cancer Mother   . Lymphoma Father     Social History   Socioeconomic History  . Marital status: Single    Spouse name: Not on file  . Number of children: Not on file  . Years of education: Not on file  . Highest education level: Not on file  Occupational History  . Not on file  Social Needs  . Financial resource strain: Not on file  . Food insecurity    Worry: Not on file    Inability: Not on file  . Transportation needs    Medical: Not on file     Non-medical: Not on file  Tobacco Use  . Smoking status: Former Research scientist (life sciences)  . Smokeless tobacco: Never Used  . Tobacco comment: quit 30 years ago  Substance and Sexual Activity  . Alcohol use: No    Frequency: Never  . Drug use: No  . Sexual activity: Not Currently  Lifestyle  . Physical activity    Days per week: Not on file    Minutes per session: Not on file  . Stress: Not on file  Relationships  . Social Herbalist on phone: Not on file    Gets together: Not on file    Attends religious service: Not on file    Active member of club or organization: Not on file    Attends meetings of clubs or organizations: Not on file    Relationship status: Not on file  . Intimate partner violence    Fear of current or ex partner: Not on file    Emotionally abused: Not on file    Physically abused: Not on file    Forced sexual activity: Not on file  Other Topics Concern  . Not on file  Social History Narrative  . Not on file      Review of Systems  Constitutional: Negative for activity change, fatigue and unexpected weight change.  HENT: Negative for congestion, postnasal drip, rhinorrhea and sinus pressure.   Respiratory: Negative for chest tightness, shortness of breath and wheezing.   Cardiovascular: Negative for chest pain and palpitations.  Gastrointestinal: Negative for abdominal pain, constipation, diarrhea and vomiting.  Endocrine: Negative for cold intolerance, heat intolerance, polyphagia and polyuria.       Elevated blood sugars. States that he has been having trouble with bydureon pen.   Musculoskeletal: Positive for arthralgias, back pain, myalgias and neck pain.       Right knee pain with swelling since fall on 10/15/2018. Has reduced his ROM and actiity. Sees pain management   Skin: Negative for rash.       Rough and dry skin on bilateral lower extremities. States it's been like this for years. States he was told this was just due to diabetes. Has been given a  few different creams for this, but nothing has worked. Left lower extremity has unhealed lesions with scabbing.   Allergic/Immunologic: Negative for environmental allergies.  Neurological: Positive for seizures and headaches.  Hematological: Negative for adenopathy.  Psychiatric/Behavioral: Positive for agitation. The patient is nervous/anxious.     Today's Vitals   06/25/19 0935  BP: 135/77  Pulse: 79  Resp: 16  SpO2: 94%  Weight: 211 lb (95.7 kg)  Height: 5' 11"  (1.803 m)   Body mass index is 29.43 kg/m.  Physical Exam Vitals signs and nursing note reviewed.  Constitutional:      Appearance: Normal  appearance. He is well-developed.  HENT:     Head: Normocephalic and atraumatic.     Nose: Nose normal.  Eyes:     Conjunctiva/sclera: Conjunctivae normal.     Pupils: Pupils are equal, round, and reactive to light.  Neck:     Musculoskeletal: Normal range of motion and neck supple.     Thyroid: No thyromegaly.     Vascular: No carotid bruit or JVD.     Trachea: No tracheal deviation.  Cardiovascular:     Rate and Rhythm: Normal rate and regular rhythm.     Heart sounds: Normal heart sounds.  Pulmonary:     Effort: Pulmonary effort is normal.     Breath sounds: Normal breath sounds.  Abdominal:     General: Bowel sounds are normal. There is no distension.     Palpations: Abdomen is soft.     Tenderness: There is no abdominal tenderness.  Lymphadenopathy:     Cervical: No cervical adenopathy.  Skin:    General: Skin is warm and dry.     Capillary Refill: Capillary refill takes less than 2 seconds.     Findings: No rash.     Comments: Left lower extremity has multiple scabbed lesions in different stages of healing. Skin is rough and scaly on both lower extremities.   Neurological:     Mental Status: He is alert and oriented to person, place, and time. Mental status is at baseline.     Cranial Nerves: No cranial nerve deficit.     Sensory: No sensory deficit.      Motor: No abnormal muscle tone.  Psychiatric:        Mood and Affect: Mood is anxious.        Speech: Speech normal.        Behavior: Behavior normal.        Thought Content: Thought content normal.        Judgment: Judgment normal.   Assessment/Plan:  1. Uncontrolled type 2 diabetes mellitus with hyperglycemia (HCC) - POCT HgB A1C 9.0 today. Will stop bydureon. Start ozempic 0.70m weekly. Continue metformin and glimepiride as prescribed.  - Semaglutide,0.25 or 0.5MG/DOS, (OZEMPIC, 0.25 OR 0.5 MG/DOSE,) 2 MG/1.5ML SOPN; Inject 0.5 mg into the skin once a week.  Dispense: 1 pen; Refill: 3  2. Essential (primary) hypertension Stable. Continue bp medication as prescribed.   3. Rash and other nonspecific skin eruption Continue with topical medications as prescribed. Refer to dermatology for further evaluation and treatment.   4. Atopic dermatitis, unspecified type Continue with topical medications as prescribed. Refer to dermatology for further evaluation and treatment.  - Ambulatory referral to Dermatology  General Counseling: Jjeffey janssenunderstanding of the findings of todays visit and agrees with plan of treatment. I have discussed any further diagnostic evaluation that may be needed or ordered today. We also reviewed his medications today. he has been encouraged to call the office with any questions or concerns that should arise related to todays visit.   Diabetes Counseling:  1. Addition of ACE inh/ ARB'S for nephroprotection. Microalbumin is updated  2. Diabetic foot care, prevention of complications. Podiatry consult 3. Exercise and lose weight.  4. Diabetic eye examination, Diabetic eye exam is updated  5. Monitor blood sugar closlely. nutrition counseling.  6. Sign and symptoms of hypoglycemia including shaking sweating,confusion and headaches.  This patient was seen by HLeretha PolFNP Collaboration with Dr FLavera Guiseas a part of collaborative care  agreement  Orders Placed  This Encounter  Procedures  . Ambulatory referral to Dermatology  . POCT HgB A1C    Meds ordered this encounter  Medications  . Semaglutide,0.25 or 0.5MG/DOS, (OZEMPIC, 0.25 OR 0.5 MG/DOSE,) 2 MG/1.5ML SOPN    Sig: Inject 0.5 mg into the skin once a week.    Dispense:  1 pen    Refill:  3    Sample provided today. Patient having trouble with bydureon injections    Order Specific Question:   Supervising Provider    Answer:   Lavera Guise [[id number]]    Time spent: 70 Minutes      Dr Lavera Guise Internal medicine

## 2019-07-14 ENCOUNTER — Other Ambulatory Visit: Payer: Self-pay

## 2019-07-14 DIAGNOSIS — I10 Essential (primary) hypertension: Secondary | ICD-10-CM

## 2019-07-14 MED ORDER — HYDROCHLOROTHIAZIDE 12.5 MG PO TABS: 12.5000 mg | ORAL_TABLET | Freq: Every day | ORAL | 3 refills | Status: DC

## 2019-07-26 IMAGING — CR DG ABDOMEN ACUTE W/ 1V CHEST
1 series · 5 of 5 positions shown · non-contrast
Comparison: Abdominal radiograph dated 10/05/2013.

CLINICAL DATA: Dehydration

EXAM:
DG ABDOMEN ACUTE W/ 1V CHEST

[Series 1: dg abd acute w/chest · 0.14mm/px · 5 of 5 slices shown]
[im 1/5]
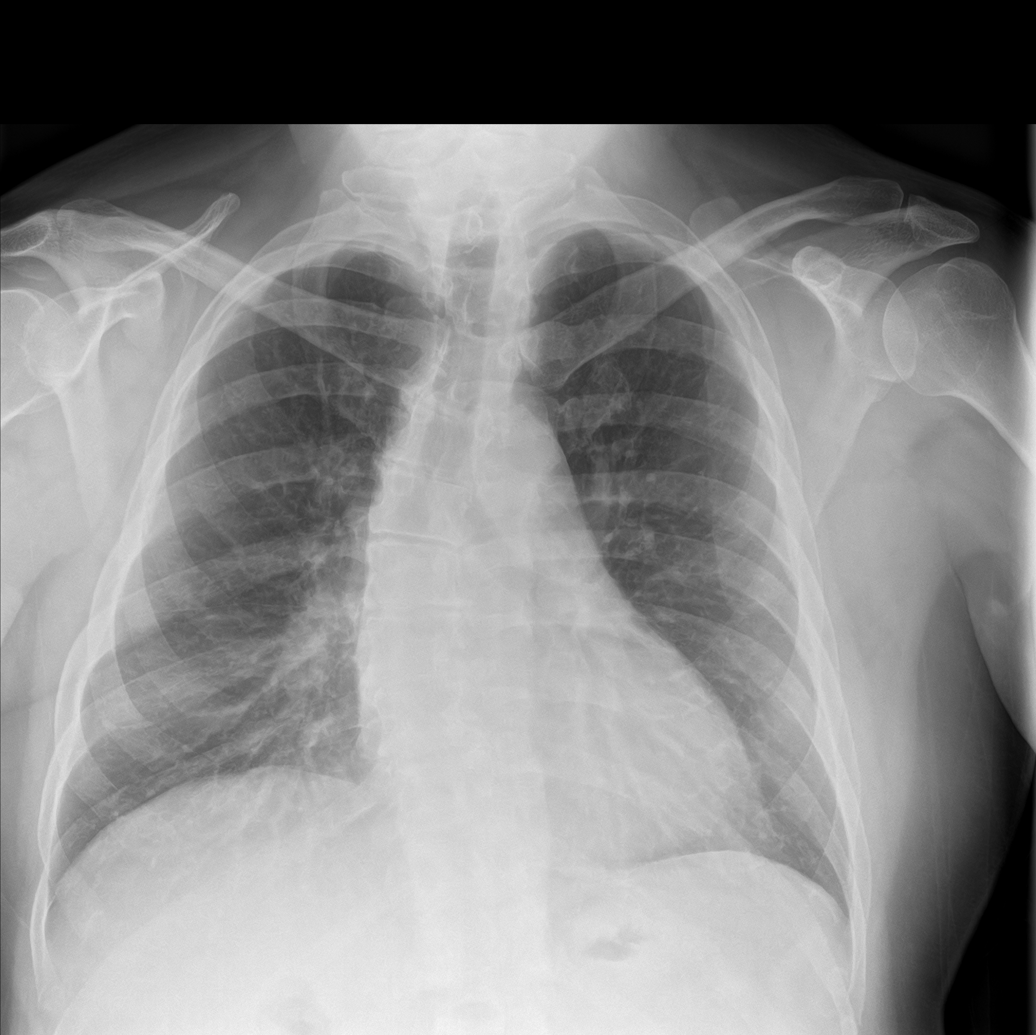
[im 2/5]
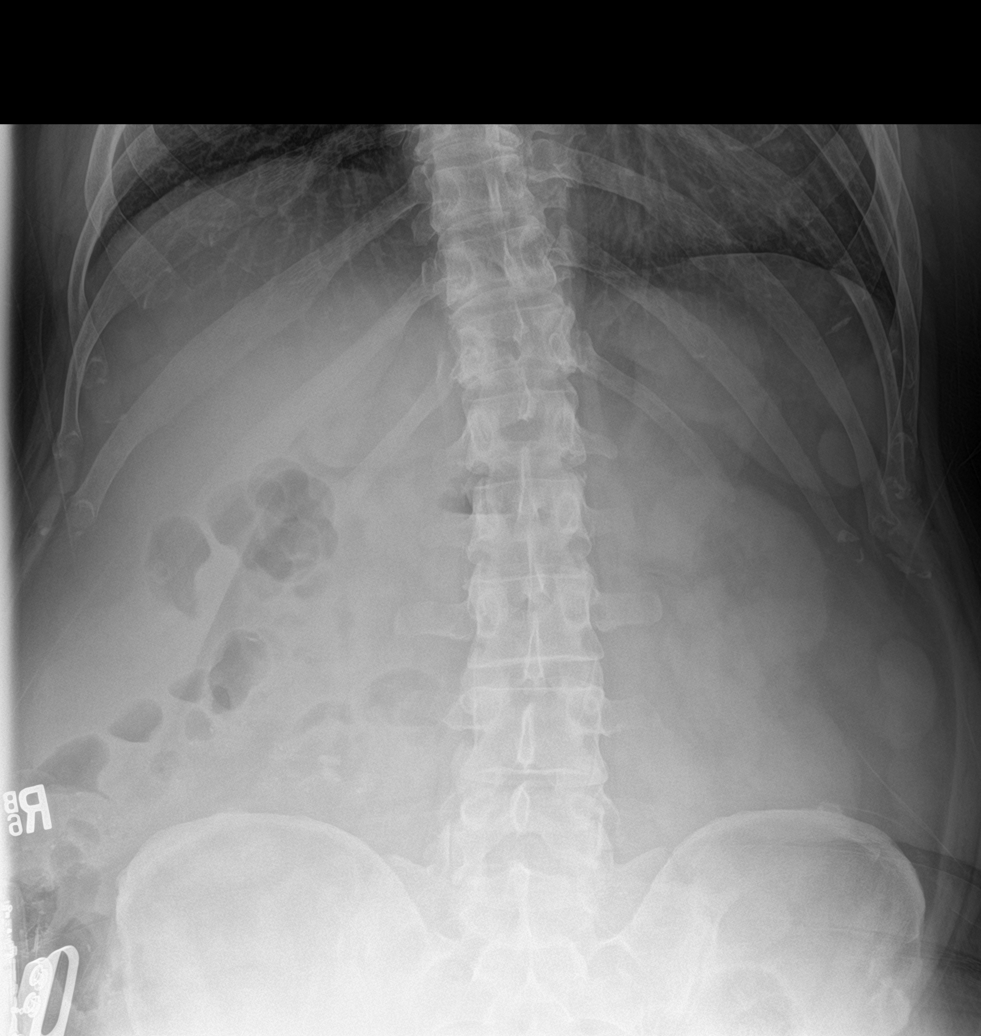
[im 3/5]
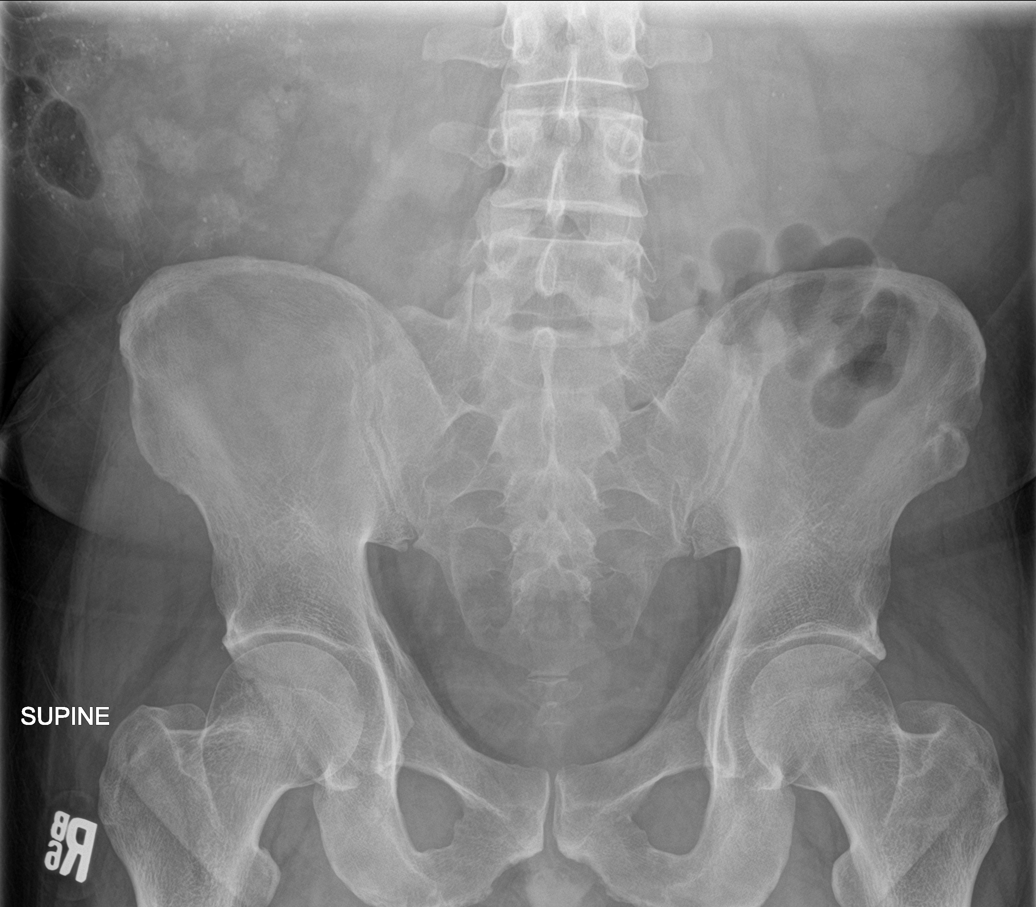
[im 4/5]
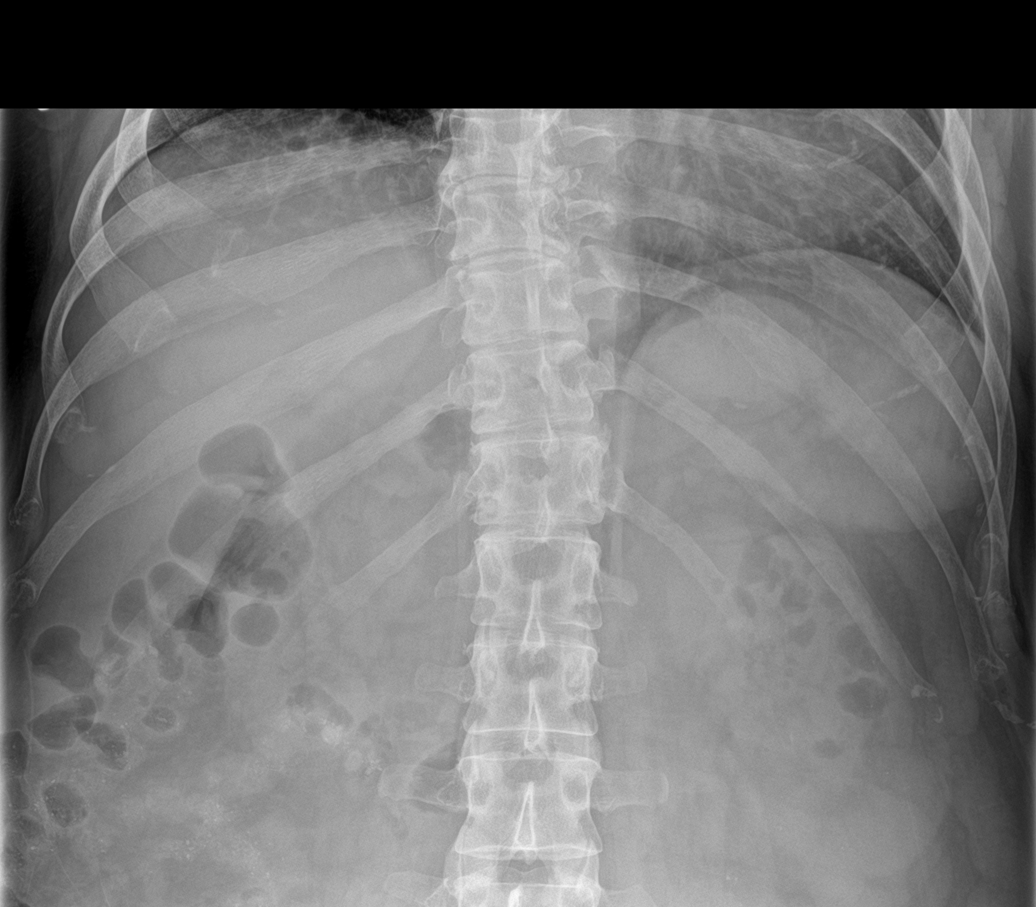
[im 5/5]
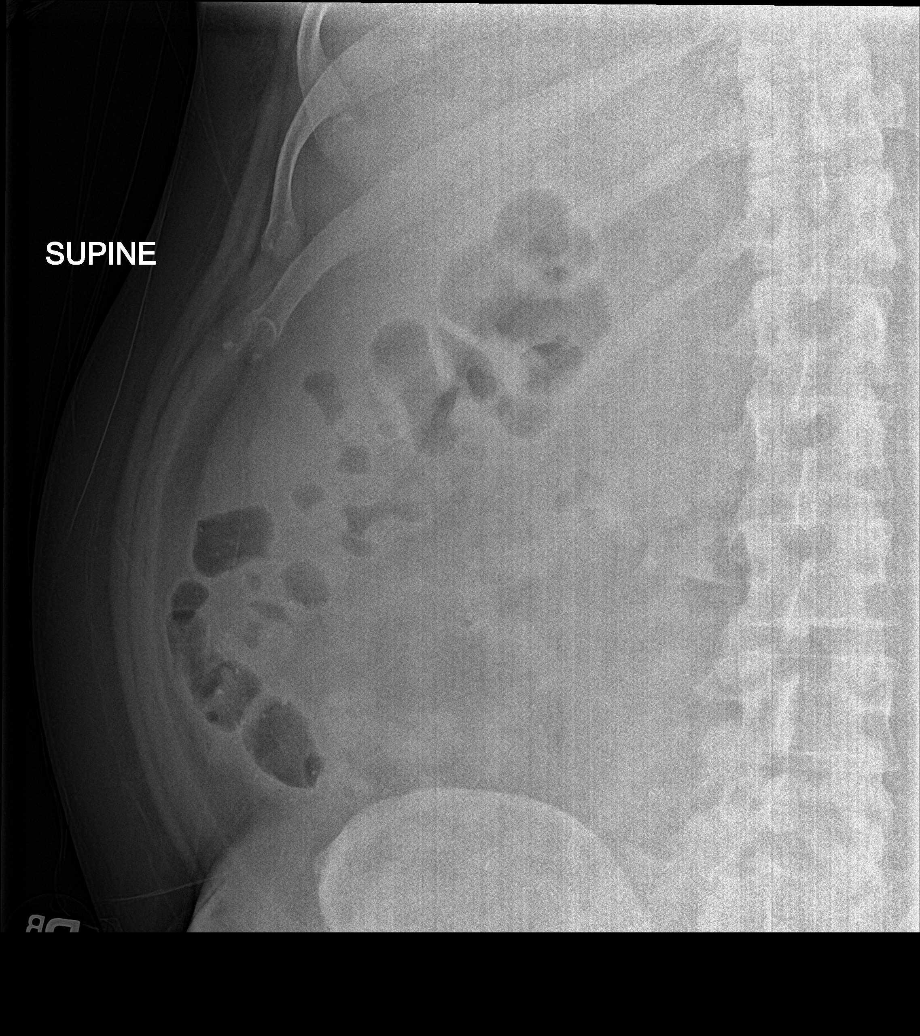

[5 of 5 positions shown; findings below may reference images not displayed]

FINDINGS: Lungs are clear.  No pleural effusion or pneumothorax.

Heart is normal in size.

Nonobstructive bowel gas pattern.

No evidence of free air under the diaphragm on the upright view.

Mild thoracic dextroscoliosis.
IMPRESSION: No evidence of acute cardiopulmonary disease.

No evidence of small bowel obstruction or free air.

## 2019-08-13 ENCOUNTER — Other Ambulatory Visit: Payer: Self-pay

## 2019-08-13 DIAGNOSIS — E1165 Type 2 diabetes mellitus with hyperglycemia: Secondary | ICD-10-CM

## 2019-08-13 DIAGNOSIS — M150 Primary generalized (osteo)arthritis: Secondary | ICD-10-CM

## 2019-08-13 MED ORDER — GLIMEPIRIDE 4 MG PO TABS: 4.0000 mg | ORAL_TABLET | Freq: Two times a day (BID) | ORAL | 3 refills | Status: DC

## 2019-08-13 MED ORDER — MELOXICAM 7.5 MG PO TABS: 7.5000 mg | ORAL_TABLET | Freq: Two times a day (BID) | ORAL | 3 refills | Status: DC

## 2019-09-28 ENCOUNTER — Other Ambulatory Visit: Payer: Self-pay

## 2019-09-28 DIAGNOSIS — E1165 Type 2 diabetes mellitus with hyperglycemia: Secondary | ICD-10-CM

## 2019-09-28 MED ORDER — METFORMIN HCL 1000 MG PO TABS: ORAL_TABLET | ORAL | 5 refills | Status: DC

## 2019-09-29 ENCOUNTER — Encounter: Payer: Medicaid Other | Admitting: Nurse Practitioner

## 2019-10-12 ENCOUNTER — Encounter: Payer: Self-pay | Admitting: Nurse Practitioner

## 2019-10-12 ENCOUNTER — Ambulatory Visit: Payer: Medicaid Other | Admitting: Nurse Practitioner

## 2019-10-12 ENCOUNTER — Other Ambulatory Visit: Payer: Self-pay

## 2019-10-12 VITALS — BP 108/68 | HR 87 | Temp 97.7°F | Resp 16 | Ht 71.0 in | Wt 208.0 lb

## 2019-10-12 DIAGNOSIS — E1165 Type 2 diabetes mellitus with hyperglycemia: Secondary | ICD-10-CM

## 2019-10-12 DIAGNOSIS — Z0001 Encounter for general adult medical examination with abnormal findings: Secondary | ICD-10-CM

## 2019-10-12 DIAGNOSIS — Z23 Encounter for immunization: Secondary | ICD-10-CM

## 2019-10-12 DIAGNOSIS — R300 Dysuria: Secondary | ICD-10-CM

## 2019-10-12 DIAGNOSIS — I10 Essential (primary) hypertension: Secondary | ICD-10-CM

## 2019-10-12 LAB — POCT GLYCOSYLATED HEMOGLOBIN (HGB A1C): Hemoglobin A1C: 9.1 % — AB (ref 4.0–5.6)

## 2019-10-12 MED ORDER — OZEMPIC (1 MG/DOSE) 2 MG/1.5ML ~~LOC~~ SOPN: 1.0000 mg | PEN_INJECTOR | SUBCUTANEOUS | 5 refills | Status: DC

## 2019-10-12 NOTE — Addendum Note (Signed)
Addended by: Lenon Oms on: 10/12/2019 04:05 PM   Modules accepted: Orders

## 2019-10-12 NOTE — Progress Notes (Signed)
[facility] [place], [place] [address]  Internal MEDICINE  Office Visit Note  Patient Name: Joseph Stark  [id number]  [id number]  Date of Service: 10/12/2019   Pt is here for routine health maintenance examination   Chief Complaint  Patient presents with  . Annual Exam  . Diabetes  . Quality Metric Gaps    diabetic eye and foot exam      The patient is here for health maintenance exam. Blood sugars continue to run high. HgbA1c is 9.1 today. He is taking metformin 556m at night, glimepiride 4647mtwice daily and ozempic 0.47m32meekly. Increased ozempic to 1mg72mekly today. Next injection is due tomorrow. Blood pressure is well managed. He is due to have diabetic eye exam. [facility]     Current Medication: Outpatient Encounter Medications as of 10/12/2019  Medication Sig Note  . ACCU-CHEK SOFTCLIX LANCETS lancets USE AS DIRECTED TO CHECK BLOOD SUGARS THREE TIMES A DAY DX CODE E11.9   . beclomethasone (QVAR) 40 MCG/ACT inhaler Inhale 2 puffs into the lungs 2 (two) times daily.   . clobetasol cream (TEMOVATE) 0.053.29pply 1 application topically 2 (two) times daily.   . Exenatide ER (BYDUREON) 2 MG PEN Inject 2 mg into the skin once a week.   . fenofibrate (TRICOR) 145 MG tablet Take 145 mg by mouth daily.   . fexofenadine (ALLEGRA) 180 MG tablet Take 180 mg by mouth daily.   . gaMarland Kitchenapentin (NEURONTIN) 300 MG capsule TAKE ONE CAPSULE BY MOUTH EVERY MORNING, 1 AT MIDDAY AND 2 AT AT BEDTIME   . glimepiride (AMARYL) 4 MG tablet Take 1 tablet (4 mg total) by mouth 2 (two) times daily.   . glMarland Kitchencose blood (ACCU-CHEK AVIVA PLUS) test strip CHECK BLOOD SUGARS 3 TIMES A DAY, DX- UNCONTROLLED DIABETES   . hydrochlorothiazide (HYDRODIURIL) 12.5 MG tablet Take 1 tablet (12.5 mg total) by mouth daily.   . Lancets Misc. (ACCU-CHEK SOFTCLIX LANCET DEV) KIT by Does not apply route.   . meloxicam (MOBIC) 7.5 MG tablet Take 1  tablet (7.5 mg total) by mouth 2 (two) times daily.   . metFORMIN (GLUCOPHAGE) 1000 MG tablet Take 1 tablet po QAM and take 1/2 tablet po QPM   . metoprolol succinate (TOPROL-XL) 100 MG 24 hr tablet Take 1 tablet (100 mg total) by mouth daily. Take with or immediately following a meal.   . morphine (MSIR) 30 MG tablet Take 30 mg by mouth every 12 (twelve) hours.   . nyMarland Kitchentatin-triamcinolone (MYCOLOG II) cream Apply 1 application topically 2 (two) times daily.   . omMarland Kitchenprazole (PRILOSEC) 40 MG capsule TAKE ONE CAPSULE DAILY FOR HEARTBURN   . pneumococcal 23 valent vaccine (PNEUMOVAX 23) 25 MCG/0.5ML injection Inject 0.47ml 28monce   . pravastatin (PRAVACHOL) 40 MG tablet TAKE 1 TABLET BY MOUTH EVERYDAY AT BEDTIME   . promethazine (PHENERGAN) 12.5 MG tablet Take 12.5 mg by mouth 2 (two) times daily as needed for nausea or vomiting.   . ramipril (ALTACE) 10 MG capsule TAKE 1 CAPSULE BY MOUTH EVERY DAY   . [DISCONTINUED] Semaglutide,0.25 or 0.5MG/DOS, (OZEMPIC, 0.25 OR 0.5 MG/DOSE,) 2 MG/1.5ML SOPN Inject 0.5 mg into the skin once a week. 10/12/2019: increased dose to 1mg w74mly  . Semaglutide, 1 MG/DOSE, (OZEMPIC, 1 MG/DOSE,) 2 MG/1.5ML SOPN Inject 1 mg into the skin once a week.    No facility-administered encounter medications on file as of 10/12/2019.  Surgical History: History reviewed. No pertinent surgical history.  Medical History: Past Medical History:  Diagnosis Date  . Diabetes mellitus without complication ([place])   . Hypertension   . Renal disorder   . Seizures ([place])     Family History: Family History  Problem Relation Age of Onset  . Ovarian cancer Mother   . Lymphoma Father       Review of Systems  Constitutional: Negative for activity change, fatigue and unexpected weight change.  HENT: Negative for congestion, postnasal drip, rhinorrhea and sinus pressure.   Respiratory: Negative for chest tightness, shortness of breath and wheezing.   Cardiovascular: Negative for  chest pain and palpitations.  Gastrointestinal: Negative for abdominal pain, constipation, diarrhea and vomiting.  Endocrine: Negative for cold intolerance, heat intolerance, polyphagia and polyuria.       Blood sugars are elevated. Taking all medications as prescribed.   Genitourinary: Negative for flank pain, frequency and urgency.  Musculoskeletal: Negative for arthralgias, back pain, myalgias and neck pain.  Skin: Negative for rash.       Rough and dry skin on bilateral lower extremities. States it's been like this for years. States he was told this was just due to diabetes. Has been given a few different creams for this, but nothing has worked. Left lower extremity has unhealed lesions with scabbing.   Allergic/Immunologic: Negative for environmental allergies.  Neurological: Positive for seizures and headaches.  Hematological: Negative for adenopathy.  Psychiatric/Behavioral: Positive for agitation. The patient is nervous/anxious.    Today's Vitals   10/12/19 1411  BP: 108/68  Pulse: 87  Resp: 16  Temp: 97.7 F (36.5 C)  SpO2: 96%  Weight: 208 lb (94.3 kg)  Height: _0  (1.803 m)   Body mass index is 29.01 kg/m.  Physical Exam Vitals signs and nursing note reviewed.  Constitutional:      General: He is not in acute distress.    Appearance: Normal appearance. He is well-developed. He is not diaphoretic.  HENT:     Head: Normocephalic and atraumatic.     Nose: Nose normal.     Mouth/Throat:     Pharynx: No oropharyngeal exudate.  Eyes:     Extraocular Movements: Extraocular movements intact.     Pupils: Pupils are equal, round, and reactive to light.  Neck:     Musculoskeletal: Normal range of motion and neck supple.     Thyroid: No thyromegaly.     Vascular: No JVD.     Trachea: No tracheal deviation.  Cardiovascular:     Rate and Rhythm: Normal rate and regular rhythm.     Pulses: Normal pulses.          Dorsalis pedis pulses are 2+ on the right side and 2+ on  the left side.       Posterior tibial pulses are 2+ on the right side and 2+ on the left side.     Heart sounds: Normal heart sounds. No murmur. No friction rub. No gallop.   Pulmonary:     Effort: Pulmonary effort is normal. No respiratory distress.     Breath sounds: Normal breath sounds. No wheezing or rales.  Chest:     Chest wall: No tenderness.  Abdominal:     General: Bowel sounds are normal.     Palpations: Abdomen is soft.     Tenderness: There is no abdominal tenderness.  Musculoskeletal: Normal range of motion.       Feet:  Feet:     Right  foot:     Skin integrity: Callus present.     Toenail Condition: Right toenails are normal.     Left foot:     Skin integrity: Skin integrity normal.     Toenail Condition: Left toenails are normal.  Lymphadenopathy:     Cervical: No cervical adenopathy.  Skin:    General: Skin is warm and dry.  Neurological:     Mental Status: He is alert and oriented to person, place, and time. Mental status is at baseline.     Cranial Nerves: No cranial nerve deficit.  Psychiatric:        Behavior: Behavior normal.        Thought Content: Thought content normal.        Judgment: Judgment normal.   Assessment/Plan: 1. Encounter for general adult medical examination with abnormal findings Annual health maintenance exam today.   2. Type 2 diabetes mellitus with hyperglycemia, without long-term current use of insulin (HCC) - POCT HgB A1C 9.1 today, increased ozempic to 33m weekly. Continue metformin 5010mdaily and glimepiride 13m33mwice daily. Referral for diabetic eye exam made today.  - Semaglutide, 1 MG/DOSE, (OZEMPIC, 1 MG/DOSE,) 2 MG/1.5ML SOPN; Inject 1 mg into the skin once a week.  Dispense: 1 pen; Refill: 5 - Ambulatory referral to Ophthalmology  3. Essential (primary) hypertension Stable.   4. Flu vaccine need - Flu Vaccine MDCK QUAD PF  5. Dysuria - UA/M w/rflx Culture, Routine  General Counseling: JefStanrbalizes  understanding of the findings of todays visit and agrees with plan of treatment. I have discussed any further diagnostic evaluation that may be needed or ordered today. We also reviewed his medications today. he has been encouraged to call the office with any questions or concerns that should arise related to todays visit.    Counseling:  Diabetes Counseling:  1. Addition of ACE inh/ ARB'S for nephroprotection. Microalbumin is updated  2. Diabetic foot care, prevention of complications. Podiatry consult 3. Exercise and lose weight.  4. Diabetic eye examination, Diabetic eye exam is updated  5. Monitor blood sugar closlely. nutrition counseling.  6. Sign and symptoms of hypoglycemia including shaking sweating,confusion and headaches.  This patient was seen by HeaLeretha PolP Collaboration with Dr FozLavera Guise a part of collaborative care agreement  Orders Placed This Encounter  Procedures  . Flu Vaccine MDCK QUAD PF  . UA/M w/rflx Culture, Routine  . Ambulatory referral to Ophthalmology  . POCT HgB A1C    Meds ordered this encounter  Medications  . Semaglutide, 1 MG/DOSE, (OZEMPIC, 1 MG/DOSE,) 2 MG/1.5ML SOPN    Sig: Inject 1 mg into the skin once a week.    Dispense:  1 pen    Refill:  5    Patient due for next shot tomorrow. Increased dose    Order Specific Question:   Supervising Provider    Answer:   KHALavera Guise4[[id number]] Time spent: 30 JacksonD  Internal Medicine

## 2019-11-10 ENCOUNTER — Other Ambulatory Visit: Payer: Self-pay

## 2019-11-10 DIAGNOSIS — I10 Essential (primary) hypertension: Secondary | ICD-10-CM

## 2019-11-10 MED ORDER — HYDROCHLOROTHIAZIDE 12.5 MG PO TABS: 12.5000 mg | ORAL_TABLET | Freq: Every day | ORAL | 3 refills | Status: DC

## 2019-11-10 MED ORDER — METOPROLOL SUCCINATE ER 100 MG PO TB24: 100.0000 mg | ORAL_TABLET | Freq: Every day | ORAL | 6 refills | Status: DC

## 2019-11-23 ENCOUNTER — Other Ambulatory Visit: Payer: Self-pay | Admitting: Adult Health

## 2019-12-17 ENCOUNTER — Other Ambulatory Visit: Payer: Self-pay

## 2019-12-17 DIAGNOSIS — E1165 Type 2 diabetes mellitus with hyperglycemia: Secondary | ICD-10-CM

## 2019-12-17 MED ORDER — GLIMEPIRIDE 4 MG PO TABS: 4.0000 mg | ORAL_TABLET | Freq: Two times a day (BID) | ORAL | 3 refills | Status: DC

## 2019-12-21 ENCOUNTER — Other Ambulatory Visit: Payer: Self-pay

## 2019-12-21 DIAGNOSIS — M150 Primary generalized (osteo)arthritis: Secondary | ICD-10-CM

## 2019-12-21 MED ORDER — MELOXICAM 7.5 MG PO TABS: 7.5000 mg | ORAL_TABLET | Freq: Two times a day (BID) | ORAL | 3 refills | Status: DC

## 2020-01-08 ENCOUNTER — Telehealth: Payer: Self-pay

## 2020-01-08 NOTE — Telephone Encounter (Signed)
Confirmed telephone visit with patient.klh 

## 2020-01-12 ENCOUNTER — Ambulatory Visit: Payer: Medicaid Other | Admitting: Adult Health

## 2020-01-12 ENCOUNTER — Encounter: Payer: Self-pay | Admitting: Adult Health

## 2020-01-12 ENCOUNTER — Other Ambulatory Visit: Payer: Self-pay

## 2020-01-12 VITALS — Ht 71.0 in | Wt 208.0 lb

## 2020-01-12 DIAGNOSIS — I10 Essential (primary) hypertension: Secondary | ICD-10-CM

## 2020-01-12 DIAGNOSIS — M150 Primary generalized (osteo)arthritis: Secondary | ICD-10-CM | POA: Diagnosis not present

## 2020-01-12 DIAGNOSIS — E1165 Type 2 diabetes mellitus with hyperglycemia: Secondary | ICD-10-CM | POA: Diagnosis not present

## 2020-01-12 DIAGNOSIS — J4520 Mild intermittent asthma, uncomplicated: Secondary | ICD-10-CM | POA: Diagnosis not present

## 2020-01-12 MED ORDER — RAMIPRIL 10 MG PO CAPS: ORAL_CAPSULE | ORAL | 5 refills | Status: DC

## 2020-01-12 NOTE — Progress Notes (Signed)
[facility] [place], [place] [address]  Internal MEDICINE  Telephone Visit  Patient Name: Joseph Stark  [id number]  [id number]  Date of Service: 01/12/2020  I connected with the patient at 1023 by telephone and verified the patients identity using two identifiers.   I discussed the limitations, risks, security and privacy concerns of performing an evaluation and management service by telephone and the availability of in person appointments. I also discussed with the patient that there may be a patient responsible charge related to the service.  The patient expressed understanding and agrees to proceed.    Chief Complaint  Patient presents with  . Telephone Assessment  . Telephone Screen  . Hypertension  . Diabetes    glucose 120    HPI  Pt seen via telephone. He is seen today for HTN and DM.  He reports his glucose was 120 this morning. He denies any recent issues with hyperglycemia. He is taking his medications as directed. He denies any other complaints or difficulty.  His blood pressure is well controlled.  Denies Chest pain, Shortness of breath, palpitations, headache, or blurred vision. He also has asthma and osteoarthritis, and denies any current issues at this time.    Current Medication: Outpatient Encounter Medications as of 01/12/2020  Medication Sig  . ACCU-CHEK SOFTCLIX LANCETS lancets USE AS DIRECTED TO CHECK BLOOD SUGARS THREE TIMES A DAY DX CODE E11.9  . beclomethasone (QVAR) 40 MCG/ACT inhaler Inhale 2 puffs into the lungs 2 (two) times daily.  . clobetasol cream (TEMOVATE) 7.42 % Apply 1 application topically 2 (two) times daily.  . fenofibrate (TRICOR) 145 MG tablet Take 145 mg by mouth daily.  . fexofenadine (ALLEGRA) 180 MG tablet Take 180 mg by mouth daily.  Marland Kitchen gabapentin (NEURONTIN) 300 MG capsule TAKE ONE CAPSULE BY MOUTH EVERY MORNING, 1 AT MIDDAY AND 2 AT AT BEDTIME  . glimepiride (AMARYL) 4 MG tablet Take 1 tablet (4 mg  total) by mouth 2 (two) times daily.  Marland Kitchen glucose blood (ACCU-CHEK AVIVA PLUS) test strip CHECK BLOOD SUGARS 3 TIMES A DAY, DX- UNCONTROLLED DIABETES  . hydrochlorothiazide (HYDRODIURIL) 12.5 MG tablet Take 1 tablet (12.5 mg total) by mouth daily.  . Lancets Misc. (ACCU-CHEK SOFTCLIX LANCET DEV) KIT by Does not apply route.  . meloxicam (MOBIC) 7.5 MG tablet Take 1 tablet (7.5 mg total) by mouth 2 (two) times daily.  . metFORMIN (GLUCOPHAGE) 1000 MG tablet Take 1 tablet po QAM and take 1/2 tablet po QPM  . metoprolol succinate (TOPROL-XL) 100 MG 24 hr tablet Take 1 tablet (100 mg total) by mouth daily. Take with or immediately following a meal.  . morphine (MSIR) 30 MG tablet Take 30 mg by mouth every 12 (twelve) hours.  Marland Kitchen nystatin-triamcinolone (MYCOLOG II) cream Apply 1 application topically 2 (two) times daily.  Marland Kitchen omeprazole (PRILOSEC) 40 MG capsule TAKE ONE CAPSULE DAILY FOR HEARTBURN  . pneumococcal 23 valent vaccine (PNEUMOVAX 23) 25 MCG/0.5ML injection Inject 0.68m IM once  . pravastatin (PRAVACHOL) 40 MG tablet TAKE 1 TABLET BY MOUTH EVERYDAY AT BEDTIME  . promethazine (PHENERGAN) 12.5 MG tablet Take 12.5 mg by mouth 2 (two) times daily as needed for nausea or vomiting.  . Semaglutide, 1 MG/DOSE, (OZEMPIC, 1 MG/DOSE,) 2 MG/1.5ML SOPN Inject 1 mg into the skin once a week.  . [DISCONTINUED] Exenatide ER (BYDUREON) 2 MG PEN Inject 2 mg into the skin once a week.  . [DISCONTINUED] ramipril (ALTACE) 10 MG capsule TAKE 1  CAPSULE BY MOUTH EVERY DAY   No facility-administered encounter medications on file as of 01/12/2020.    Surgical History: History reviewed. No pertinent surgical history.  Medical History: Past Medical History:  Diagnosis Date  . Diabetes mellitus without complication ([place])   . Hypertension   . Renal disorder   . Seizures ([place])     Family History: Family History  Problem Relation Age of Onset  . Ovarian cancer Mother   . Lymphoma Father     Social History    Socioeconomic History  . Marital status: Single    Spouse name: Not on file  . Number of children: Not on file  . Years of education: Not on file  . Highest education level: Not on file  Occupational History  . Not on file  Tobacco Use  . Smoking status: Former Research scientist (life sciences)  . Smokeless tobacco: Never Used  . Tobacco comment: quit 30 years ago  Substance and Sexual Activity  . Alcohol use: No  . Drug use: No  . Sexual activity: Not Currently  Other Topics Concern  . Not on file  Social History Narrative  . Not on file   Social Determinants of Health   Financial Resource Strain:   . Difficulty of Paying Living Expenses: Not on file  Food Insecurity:   . Worried About Charity fundraiser in the Last Year: Not on file  . Ran Out of Food in the Last Year: Not on file  Transportation Needs:   . Lack of Transportation (Medical): Not on file  . Lack of Transportation (Non-Medical): Not on file  Physical Activity:   . Days of Exercise per Week: Not on file  . Minutes of Exercise per Session: Not on file  Stress:   . Feeling of Stress : Not on file  Social Connections:   . Frequency of Communication with Friends and Family: Not on file  . Frequency of Social Gatherings with Friends and Family: Not on file  . Attends Religious Services: Not on file  . Active Member of Clubs or Organizations: Not on file  . Attends Archivist Meetings: Not on file  . Marital Status: Not on file  Intimate Partner Violence:   . Fear of Current or Ex-Partner: Not on file  . Emotionally Abused: Not on file  . Physically Abused: Not on file  . Sexually Abused: Not on file      Review of Systems  Constitutional: Negative.  Negative for chills, fatigue and unexpected weight change.  HENT: Negative.  Negative for congestion, rhinorrhea, sneezing and sore throat.   Eyes: Negative for redness.  Respiratory: Negative.  Negative for cough, chest tightness and shortness of breath.    Cardiovascular: Negative.  Negative for chest pain and palpitations.  Gastrointestinal: Negative.  Negative for abdominal pain, constipation, diarrhea, nausea and vomiting.  Endocrine: Negative.   Genitourinary: Negative.  Negative for dysuria and frequency.  Musculoskeletal: Negative.  Negative for arthralgias, back pain, joint swelling and neck pain.  Skin: Negative.  Negative for rash.  Allergic/Immunologic: Negative.   Neurological: Negative.  Negative for tremors and numbness.  Hematological: Negative for adenopathy. Does not bruise/bleed easily.  Psychiatric/Behavioral: Negative.  Negative for behavioral problems, sleep disturbance and suicidal ideas. The patient is not nervous/anxious.     Vital Signs: Ht 5' 11"  (1.803 m)   Wt 208 lb (94.3 kg)   BMI 29.01 kg/m    Observation/Objective:  Well sounding,NAD noted   Assessment/Plan: 1. Type 2 diabetes  mellitus with hyperglycemia, without long-term current use of insulin ([place]) Controled. Continue present medications, and come to office in 1 month for A1C check.  2. Essential (primary) hypertension Stable, continue present management.  3. Primary generalized (osteo)arthritis Controled, continue current treatment  4. Mild intermittent asthma, uncomplicated Stable, continue to use inhalers.   General Counseling: okley magnussen understanding of the findings of today's phone visit and agrees with plan of treatment. I have discussed any further diagnostic evaluation that may be needed or ordered today. We also reviewed his medications today. he has been encouraged to call the office with any questions or concerns that should arise related to todays visit.    No orders of the defined types were placed in this encounter.   No orders of the defined types were placed in this encounter.   Time spent: [facility] Internal medicine

## 2020-02-03 ENCOUNTER — Other Ambulatory Visit: Payer: Self-pay

## 2020-02-03 MED ORDER — PRAVASTATIN SODIUM 40 MG PO TABS: 40.0000 mg | ORAL_TABLET | Freq: Every day | ORAL | 6 refills | Status: DC

## 2020-02-05 ENCOUNTER — Telehealth: Payer: Self-pay

## 2020-02-05 NOTE — Telephone Encounter (Signed)
Patient rescheduled appointment on 02/09/2020 to 02/16/2020. klh

## 2020-02-09 ENCOUNTER — Ambulatory Visit: Payer: Medicaid Other | Admitting: Adult Health

## 2020-02-11 ENCOUNTER — Other Ambulatory Visit: Payer: Self-pay

## 2020-02-11 ENCOUNTER — Telehealth: Payer: Self-pay

## 2020-02-11 DIAGNOSIS — E1165 Type 2 diabetes mellitus with hyperglycemia: Secondary | ICD-10-CM

## 2020-02-11 MED ORDER — OMEPRAZOLE 40 MG PO CPDR: DELAYED_RELEASE_CAPSULE | ORAL | 7 refills | Status: DC

## 2020-02-11 MED ORDER — ACCU-CHEK AVIVA PLUS VI STRP: ORAL_STRIP | 6 refills | Status: DC

## 2020-02-11 NOTE — Telephone Encounter (Signed)
Confirmed appointment on 02/16/2020 and screened for covid. klh 

## 2020-02-15 ENCOUNTER — Other Ambulatory Visit: Payer: Self-pay

## 2020-02-15 MED ORDER — ACCU-CHEK SOFTCLIX LANCETS MISC: 6 refills | Status: DC

## 2020-02-16 ENCOUNTER — Other Ambulatory Visit: Payer: Self-pay

## 2020-02-16 ENCOUNTER — Encounter: Payer: Self-pay | Admitting: Adult Health

## 2020-02-16 ENCOUNTER — Ambulatory Visit: Payer: Medicaid Other | Admitting: Adult Health

## 2020-02-16 VITALS — BP 147/74 | HR 79 | Temp 97.7°F | Resp 16 | Ht 71.0 in | Wt 208.2 lb

## 2020-02-16 DIAGNOSIS — I10 Essential (primary) hypertension: Secondary | ICD-10-CM

## 2020-02-16 DIAGNOSIS — M150 Primary generalized (osteo)arthritis: Secondary | ICD-10-CM | POA: Diagnosis not present

## 2020-02-16 DIAGNOSIS — J4520 Mild intermittent asthma, uncomplicated: Secondary | ICD-10-CM | POA: Diagnosis not present

## 2020-02-16 DIAGNOSIS — E11649 Type 2 diabetes mellitus with hypoglycemia without coma: Secondary | ICD-10-CM

## 2020-02-16 LAB — POCT GLYCOSYLATED HEMOGLOBIN (HGB A1C): Hemoglobin A1C: 8.4 % — AB (ref 4.0–5.6)

## 2020-02-16 NOTE — Progress Notes (Signed)
[facility] [place], [place] [address]  Internal MEDICINE  Office Visit Note  Patient Name: Joseph Stark  [id number]  [id number]  Date of Service: 02/16/2020  Chief Complaint  Patient presents with  . Follow-up  . Diabetes  . Hypertension    HPI  Pt is here for follow up on DM, and HTN.  Pt reports his blood sugars are generally unde 120 in the morning.  His A1C has improved from 9.1 to 8.4 today.  He currently uses ozempic, metformin, and amaryl. His blood pressure is controlled, Denies Chest pain, Shortness of breath, palpitations, headache, or blurred vision.    Current Medication: Outpatient Encounter Medications as of 02/16/2020  Medication Sig  . Accu-Chek Softclix Lancets lancets USE AS DIRECTED TO CHECK BLOOD SUGARS THREE TIMES A DAY DX CODE E11.9  . beclomethasone (QVAR) 40 MCG/ACT inhaler Inhale 2 puffs into the lungs 2 (two) times daily.  . clobetasol cream (TEMOVATE) [id number] % Apply 1 application topically 2 (two) times daily.  . fenofibrate (TRICOR) 145 MG tablet Take 145 mg by mouth daily.  . fexofenadine (ALLEGRA) 180 MG tablet Take 180 mg by mouth daily.  Marland Kitchen gabapentin (NEURONTIN) 300 MG capsule TAKE ONE CAPSULE BY MOUTH EVERY MORNING, 1 AT MIDDAY AND 2 AT AT BEDTIME  . glimepiride (AMARYL) 4 MG tablet Take 1 tablet (4 mg total) by mouth 2 (two) times daily.  Marland Kitchen glucose blood (ACCU-CHEK AVIVA PLUS) test strip CHECK BLOOD SUGARS 3 TIMES A DAY, DX- UNCONTROLLED DIABETES  . hydrochlorothiazide (HYDRODIURIL) 12.5 MG tablet Take 1 tablet (12.5 mg total) by mouth daily.  . meloxicam (MOBIC) 7.5 MG tablet Take 1 tablet (7.5 mg total) by mouth 2 (two) times daily.  . metFORMIN (GLUCOPHAGE) 1000 MG tablet Take 1 tablet po QAM and take 1/2 tablet po QPM  . metoprolol succinate (TOPROL-XL) 100 MG 24 hr tablet Take 1 tablet (100 mg total) by mouth daily. Take with or immediately following a meal.  . morphine (MSIR) 30 MG tablet Take 30 mg by mouth  every 12 (twelve) hours.  Marland Kitchen nystatin-triamcinolone (MYCOLOG II) cream Apply 1 application topically 2 (two) times daily.  Marland Kitchen omeprazole (PRILOSEC) 40 MG capsule TAKE ONE CAPSULE DAILY FOR HEARTBURN  . pneumococcal 23 valent vaccine (PNEUMOVAX 23) 25 MCG/0.5ML injection Inject 0.78ml IM once  . pravastatin (PRAVACHOL) 40 MG tablet Take 1 tablet (40 mg total) by mouth at bedtime.  . promethazine (PHENERGAN) 12.5 MG tablet Take 12.5 mg by mouth 2 (two) times daily as needed for nausea or vomiting.  . ramipril (ALTACE) 10 MG capsule TAKE 1 CAPSULE BY MOUTH EVERY DAY  . Semaglutide, 1 MG/DOSE, (OZEMPIC, 1 MG/DOSE,) 2 MG/1.5ML SOPN Inject 1 mg into the skin once a week.   No facility-administered encounter medications on file as of 02/16/2020.    Surgical History: History reviewed. No pertinent surgical history.  Medical History: Past Medical History:  Diagnosis Date  . Diabetes mellitus without complication ([place])   . Hypertension   . Renal disorder   . Seizures ([place])     Family History: Family History  Problem Relation Age of Onset  . Ovarian cancer Mother   . Lymphoma Father     Social History   Socioeconomic History  . Marital status: Single    Spouse name: Not on file  . Number of children: Not on file  . Years of education: Not on file  . Highest education level: Not on file  Occupational History  .  Not on file  Tobacco Use  . Smoking status: Former Research scientist (life sciences)  . Smokeless tobacco: Never Used  . Tobacco comment: quit 30 years ago  Substance and Sexual Activity  . Alcohol use: No  . Drug use: No  . Sexual activity: Not Currently  Other Topics Concern  . Not on file  Social History Narrative  . Not on file   Social Determinants of Health   Financial Resource Strain:   . Difficulty of Paying Living Expenses: Not on file  Food Insecurity:   . Worried About Charity fundraiser in the Last Year: Not on file  . Ran Out of Food in the Last Year: Not on file  Transportation  Needs:   . Lack of Transportation (Medical): Not on file  . Lack of Transportation (Non-Medical): Not on file  Physical Activity:   . Days of Exercise per Week: Not on file  . Minutes of Exercise per Session: Not on file  Stress:   . Feeling of Stress : Not on file  Social Connections:   . Frequency of Communication with Friends and Family: Not on file  . Frequency of Social Gatherings with Friends and Family: Not on file  . Attends Religious Services: Not on file  . Active Member of Clubs or Organizations: Not on file  . Attends Archivist Meetings: Not on file  . Marital Status: Not on file  Intimate Partner Violence:   . Fear of Current or Ex-Partner: Not on file  . Emotionally Abused: Not on file  . Physically Abused: Not on file  . Sexually Abused: Not on file      Review of Systems  Constitutional: Negative.  Negative for chills, fatigue and unexpected weight change.  HENT: Negative.  Negative for congestion, rhinorrhea, sneezing and sore throat.   Eyes: Negative for redness.  Respiratory: Negative.  Negative for cough, chest tightness and shortness of breath.   Cardiovascular: Negative.  Negative for chest pain and palpitations.  Gastrointestinal: Negative.  Negative for abdominal pain, constipation, diarrhea, nausea and vomiting.  Endocrine: Negative.   Genitourinary: Negative.  Negative for dysuria and frequency.  Musculoskeletal: Negative.  Negative for arthralgias, back pain, joint swelling and neck pain.  Skin: Negative.  Negative for rash.  Allergic/Immunologic: Negative.   Neurological: Negative.  Negative for tremors and numbness.  Hematological: Negative for adenopathy. Does not bruise/bleed easily.  Psychiatric/Behavioral: Negative.  Negative for behavioral problems, sleep disturbance and suicidal ideas. The patient is not nervous/anxious.     Vital Signs: BP (!) 147/74   Pulse 79   Temp 97.7 F (36.5 C)   Resp 16   Ht 5\' 11"  (1.803 m)   Wt  208 lb 3.2 oz (94.4 kg)   SpO2 94%   BMI 29.04 kg/m    Physical Exam Vitals and nursing note reviewed.  Constitutional:      General: He is not in acute distress.    Appearance: He is well-developed. He is not diaphoretic.  HENT:     Head: Normocephalic and atraumatic.     Mouth/Throat:     Pharynx: No oropharyngeal exudate.  Eyes:     Pupils: Pupils are equal, round, and reactive to light.  Neck:     Thyroid: No thyromegaly.     Vascular: No JVD.     Trachea: No tracheal deviation.  Cardiovascular:     Rate and Rhythm: Normal rate and regular rhythm.     Heart sounds: Normal heart sounds. No murmur.  No friction rub. No gallop.   Pulmonary:     Effort: Pulmonary effort is normal. No respiratory distress.     Breath sounds: Normal breath sounds. No wheezing or rales.  Chest:     Chest wall: No tenderness.  Abdominal:     Palpations: Abdomen is soft.     Tenderness: There is no abdominal tenderness. There is no guarding.  Musculoskeletal:        General: Normal range of motion.     Cervical back: Normal range of motion and neck supple.  Lymphadenopathy:     Cervical: No cervical adenopathy.  Skin:    General: Skin is warm and dry.  Neurological:     Mental Status: He is alert and oriented to person, place, and time.     Cranial Nerves: No cranial nerve deficit.  Psychiatric:        Behavior: Behavior normal.        Thought Content: Thought content normal.        Judgment: Judgment normal.    Assessment/Plan: 1. Uncontrolled type 2 diabetes mellitus with hypoglycemia, unspecified hypoglycemia coma status (HCC) Improving A1C.  Today is 8.4, continue current medications.  - POCT HgB A1C  2. Essential (primary) hypertension Stable, continue present medications.   3. Primary generalized (osteo)arthritis Controlled, continue meloxicam as before.   4. Mild intermittent asthma, uncomplicated Good symptom management.  Continue to monitor.   General Counseling:  powell grelle understanding of the findings of todays visit and agrees with plan of treatment. I have discussed any further diagnostic evaluation that may be needed or ordered today. We also reviewed his medications today. he has been encouraged to call the office with any questions or concerns that should arise related to todays visit.    Orders Placed This Encounter  Procedures  . POCT HgB A1C    No orders of the defined types were placed in this encounter.   Time spent: 25 Minutes   This patient was seen by Orson Gear AGNP-C in Collaboration with Dr Lavera Guise as a part of collaborative care agreement     Kendell Bane AGNP-C Internal medicine

## 2020-02-19 ENCOUNTER — Other Ambulatory Visit: Payer: Self-pay | Admitting: Internal Medicine

## 2020-03-21 ENCOUNTER — Other Ambulatory Visit: Payer: Self-pay

## 2020-03-21 DIAGNOSIS — I10 Essential (primary) hypertension: Secondary | ICD-10-CM

## 2020-03-21 MED ORDER — HYDROCHLOROTHIAZIDE 12.5 MG PO TABS: 12.5000 mg | ORAL_TABLET | Freq: Every day | ORAL | 3 refills | Status: DC

## 2020-04-11 ENCOUNTER — Other Ambulatory Visit: Payer: Self-pay

## 2020-04-11 DIAGNOSIS — E1165 Type 2 diabetes mellitus with hyperglycemia: Secondary | ICD-10-CM

## 2020-04-11 MED ORDER — GLIMEPIRIDE 4 MG PO TABS: 4.0000 mg | ORAL_TABLET | Freq: Two times a day (BID) | ORAL | 3 refills | Status: DC

## 2020-04-18 ENCOUNTER — Other Ambulatory Visit: Payer: Self-pay

## 2020-04-18 DIAGNOSIS — M150 Primary generalized (osteo)arthritis: Secondary | ICD-10-CM

## 2020-04-18 DIAGNOSIS — E1165 Type 2 diabetes mellitus with hyperglycemia: Secondary | ICD-10-CM

## 2020-04-18 MED ORDER — MELOXICAM 7.5 MG PO TABS: 7.5000 mg | ORAL_TABLET | Freq: Two times a day (BID) | ORAL | 3 refills | Status: DC

## 2020-04-18 MED ORDER — OZEMPIC (1 MG/DOSE) 2 MG/1.5ML ~~LOC~~ SOPN: 1.0000 mg | PEN_INJECTOR | SUBCUTANEOUS | 5 refills | Status: DC

## 2020-05-17 ENCOUNTER — Telehealth: Payer: Self-pay

## 2020-05-17 NOTE — Telephone Encounter (Signed)
Vm full unable to lmom for 05-19-20 ov reminder.

## 2020-05-19 ENCOUNTER — Other Ambulatory Visit: Payer: Self-pay

## 2020-05-19 ENCOUNTER — Ambulatory Visit: Payer: Medicaid Other | Admitting: Nurse Practitioner

## 2020-05-19 ENCOUNTER — Encounter: Payer: Self-pay | Admitting: Nurse Practitioner

## 2020-05-19 VITALS — BP 135/91 | HR 82 | Resp 16 | Ht 71.0 in | Wt 209.0 lb

## 2020-05-19 DIAGNOSIS — M25512 Pain in left shoulder: Secondary | ICD-10-CM | POA: Diagnosis not present

## 2020-05-19 DIAGNOSIS — M545 Low back pain, unspecified: Secondary | ICD-10-CM

## 2020-05-19 DIAGNOSIS — I10 Essential (primary) hypertension: Secondary | ICD-10-CM | POA: Diagnosis not present

## 2020-05-19 DIAGNOSIS — E1165 Type 2 diabetes mellitus with hyperglycemia: Secondary | ICD-10-CM | POA: Diagnosis not present

## 2020-05-19 DIAGNOSIS — G8929 Other chronic pain: Secondary | ICD-10-CM

## 2020-05-19 LAB — POCT GLYCOSYLATED HEMOGLOBIN (HGB A1C): Hemoglobin A1C: 7.7 % — AB (ref 4.0–5.6)

## 2020-05-19 NOTE — Progress Notes (Signed)
[facility] [place], [place] [address]  Internal MEDICINE  Office Visit Note  Patient Name: Joseph Stark  [id number]  [id number]  Date of Service: 05/22/2020  Chief Complaint  Patient presents with  . Diabetes  . Hypertension  . Seizures  . Back Pain    upper back pain, when bending down and the pain radiates up to shoulders and down to arm     The patient is here for routine follow up. Blood sugars doing better. His HgbA1c is 7.7 today, down from 8.4 at last check.  Today, he has left shoulder and upper arm pain. Gets sharp, burning pain up his arm when he tries to move something behind him.  He also states that when he bends down to pick something up, he will get sharp pain in the left lower side of his back which radiates all the way up to his skull. He sees pain management provider. Takes morphine as needed and as prescribed. States that this is not helping pain. He states that he does have known history of scoliosis.       Current Medication: Outpatient Encounter Medications as of 05/19/2020  Medication Sig  . Accu-Chek Softclix Lancets lancets USE AS DIRECTED TO CHECK BLOOD SUGARS THREE TIMES A DAY DX CODE E11.9  . beclomethasone (QVAR) 40 MCG/ACT inhaler Inhale 2 puffs into the lungs 2 (two) times daily.  . clobetasol cream (TEMOVATE) [id number] % Apply 1 application topically 2 (two) times daily.  . fenofibrate (TRICOR) 145 MG tablet Take 145 mg by mouth daily.  . fexofenadine (ALLEGRA) 180 MG tablet Take 180 mg by mouth daily.  Marland Kitchen gabapentin (NEURONTIN) 300 MG capsule TAKE ONE CAPSULE BY MOUTH EVERY MORNING, 1 AT MIDDAY AND 2 AT AT BEDTIME  . glimepiride (AMARYL) 4 MG tablet Take 1 tablet (4 mg total) by mouth 2 (two) times daily.  Marland Kitchen glucose blood (ACCU-CHEK AVIVA PLUS) test strip CHECK BLOOD SUGARS 3 TIMES A DAY, DX- UNCONTROLLED DIABETES  . hydrochlorothiazide (HYDRODIURIL) 12.5 MG tablet Take 1 tablet (12.5 mg total) by mouth daily.  .  meloxicam (MOBIC) 7.5 MG tablet Take 1 tablet (7.5 mg total) by mouth 2 (two) times daily.  . metFORMIN (GLUCOPHAGE) 1000 MG tablet Take 1 tablet po QAM and take 1/2 tablet po QPM  . metoprolol succinate (TOPROL-XL) 100 MG 24 hr tablet Take 1 tablet (100 mg total) by mouth daily. Take with or immediately following a meal.  . morphine (MSIR) 30 MG tablet Take 30 mg by mouth every 12 (twelve) hours.  Marland Kitchen nystatin-triamcinolone (MYCOLOG II) cream Apply 1 application topically 2 (two) times daily.  Marland Kitchen omeprazole (PRILOSEC) 40 MG capsule TAKE ONE CAPSULE DAILY FOR HEARTBURN  . pneumococcal 23 valent vaccine (PNEUMOVAX 23) 25 MCG/0.5ML injection Inject 0.65ml IM once  . pravastatin (PRAVACHOL) 40 MG tablet Take 1 tablet (40 mg total) by mouth at bedtime.  . promethazine (PHENERGAN) 12.5 MG tablet Take 12.5 mg by mouth 2 (two) times daily as needed for nausea or vomiting.  . ramipril (ALTACE) 10 MG capsule TAKE 1 CAPSULE BY MOUTH EVERY DAY  . Semaglutide, 1 MG/DOSE, (OZEMPIC, 1 MG/DOSE,) 2 MG/1.5ML SOPN Inject 1 mg into the skin once a week.   No facility-administered encounter medications on file as of 05/19/2020.    Surgical History: History reviewed. No pertinent surgical history.  Medical History: Past Medical History:  Diagnosis Date  . Diabetes mellitus without complication ([place])   . Hypertension   . Renal  disorder   . Seizures ([place])     Family History: Family History  Problem Relation Age of Onset  . Ovarian cancer Mother   . Lymphoma Father     Social History   Socioeconomic History  . Marital status: Single    Spouse name: Not on file  . Number of children: Not on file  . Years of education: Not on file  . Highest education level: Not on file  Occupational History  . Not on file  Tobacco Use  . Smoking status: Former Smoker    Types: Cigarettes  . Smokeless tobacco: Never Used  . Tobacco comment: quit 30 years ago  Substance and Sexual Activity  . Alcohol use: No  .  Drug use: No  . Sexual activity: Not Currently  Other Topics Concern  . Not on file  Social History Narrative  . Not on file   Social Determinants of Health   Financial Resource Strain:   . Difficulty of Paying Living Expenses:   Food Insecurity:   . Worried About Charity fundraiser in the Last Year:   . Arboriculturist in the Last Year:   Transportation Needs:   . Film/video editor (Medical):   Marland Kitchen Lack of Transportation (Non-Medical):   Physical Activity:   . Days of Exercise per Week:   . Minutes of Exercise per Session:   Stress:   . Feeling of Stress :   Social Connections:   . Frequency of Communication with Friends and Family:   . Frequency of Social Gatherings with Friends and Family:   . Attends Religious Services:   . Active Member of Clubs or Organizations:   . Attends Archivist Meetings:   Marland Kitchen Marital Status:   Intimate Partner Violence:   . Fear of Current or Ex-Partner:   . Emotionally Abused:   Marland Kitchen Physically Abused:   . Sexually Abused:       Review of Systems  Constitutional: Negative for activity change, chills, fatigue and unexpected weight change.  HENT: Negative for congestion, postnasal drip, rhinorrhea, sneezing and sore throat.   Respiratory: Negative for cough, chest tightness, shortness of breath and wheezing.   Cardiovascular: Negative for chest pain and palpitations.  Gastrointestinal: Negative for abdominal pain, constipation, diarrhea, nausea and vomiting.  Endocrine:       Improved blood sugars.   Musculoskeletal: Positive for arthralgias, back pain and myalgias. Negative for joint swelling and neck pain.       Left shoulder pain, worse when reaching behind.  Lower back pain. This is more severe when bending down at the waist. Only hurts on left side of the lower back.   Skin: Negative for rash.  Allergic/Immunologic: Negative for environmental allergies.  Neurological: Negative for dizziness, tremors, numbness and  headaches.  Hematological: Negative for adenopathy. Does not bruise/bleed easily.  Psychiatric/Behavioral: Negative for behavioral problems (Depression), sleep disturbance and suicidal ideas. The patient is not nervous/anxious.     Today's Vitals   05/19/20 1002  BP: (!) 135/91  Pulse: 82  Resp: 16  SpO2: 97%  Weight: 209 lb (94.8 kg)  Height: 5\' 11"  (1.803 m)   Body mass index is 29.15 kg/m.  Physical Exam Vitals and nursing note reviewed.  Constitutional:      General: He is not in acute distress.    Appearance: Normal appearance. He is well-developed. He is not diaphoretic.  HENT:     Head: Normocephalic and atraumatic.     Mouth/Throat:  Pharynx: No oropharyngeal exudate.  Eyes:     Pupils: Pupils are equal, round, and reactive to light.  Neck:     Thyroid: No thyromegaly.     Vascular: No JVD.     Trachea: No tracheal deviation.  Cardiovascular:     Rate and Rhythm: Normal rate and regular rhythm.     Heart sounds: Normal heart sounds. No murmur. No friction rub. No gallop.   Pulmonary:     Effort: Pulmonary effort is normal. No respiratory distress.     Breath sounds: Normal breath sounds. No wheezing or rales.  Chest:     Chest wall: No tenderness.  Abdominal:     Palpations: Abdomen is soft.     Tenderness: There is no abdominal tenderness. There is no guarding.  Musculoskeletal:        General: Normal range of motion.     Cervical back: Normal range of motion and neck supple.     Comments: Left shoulder pain, more severe when reaching behind him and also with picking up anything weight weight. There are no palpable or visible abnormalities. No crepitus can be felt with movement of the left arm. Moderate lowe back pain, worse with bending and twisting at the waist.   Lymphadenopathy:     Cervical: No cervical adenopathy.  Skin:    General: Skin is warm and dry.  Neurological:     Mental Status: He is alert and oriented to person, place, and time.      Cranial Nerves: No cranial nerve deficit.  Psychiatric:        Mood and Affect: Mood normal.        Behavior: Behavior normal.        Thought Content: Thought content normal.        Judgment: Judgment normal.   Assessment/Plan: 1. Type 2 diabetes mellitus with hyperglycemia, without long-term current use of insulin (HCC) - POCT HgB A1C 7.7 today. Continue diabetic medication as prescribed   2. Essential (primary) hypertension Stable. Continue bp medication as prescribed.   3. Chronic left shoulder pain Will get x-ray of left shoulder for further evaluation. Refer to orthopedics as indicated.  - DG Shoulder Left; Future  4. Chronic left-sided low back pain without sciatica Will get x-ray of lumbar spine for further evaluation. Refer to orthopedics as indicated.  - DG Lumbar Spine Complete; Future  General Counseling: el pensinger understanding of the findings of todays visit and agrees with plan of treatment. I have discussed any further diagnostic evaluation that may be needed or ordered today. We also reviewed his medications today. he has been encouraged to call the office with any questions or concerns that should arise related to todays visit.  Diabetes Counseling:  1. Addition of ACE inh/ ARB'S for nephroprotection. Microalbumin is updated  2. Diabetic foot care, prevention of complications. Podiatry consult 3. Exercise and lose weight.  4. Diabetic eye examination, Diabetic eye exam is updated  5. Monitor blood sugar closlely. nutrition counseling.  6. Sign and symptoms of hypoglycemia including shaking sweating,confusion and headaches.   This patient was seen by Leretha Pol FNP Collaboration with Dr Lavera Guise as a part of collaborative care agreement  Orders Placed This Encounter  Procedures  . DG Shoulder Left  . DG Lumbar Spine Complete  . POCT HgB A1C     Total time spent: 30 Minutes   Time spent includes review of chart, medications, test results,  and follow up plan with the patient.  Dr Lavera Guise Internal medicine

## 2020-05-23 ENCOUNTER — Other Ambulatory Visit: Payer: Self-pay | Admitting: Adult Health

## 2020-05-24 ENCOUNTER — Ambulatory Visit
Admission: RE | Admit: 2020-05-24 | Discharge: 2020-05-24 | Disposition: A | Discharge status: Home / Self Care | Payer: Medicaid Other | Source: Ambulatory Visit | Attending: Nurse Practitioner | Admitting: Nurse Practitioner

## 2020-05-24 ENCOUNTER — Ambulatory Visit
Admission: RE | Admit: 2020-05-24 | Discharge: 2020-05-24 | Disposition: A | Discharge status: Home / Self Care | Payer: Medicaid Other | Attending: Nurse Practitioner | Admitting: Nurse Practitioner

## 2020-05-24 ENCOUNTER — Other Ambulatory Visit: Payer: Self-pay

## 2020-05-24 DIAGNOSIS — G8929 Other chronic pain: Secondary | ICD-10-CM

## 2020-05-24 DIAGNOSIS — M25512 Pain in left shoulder: Secondary | ICD-10-CM | POA: Insufficient documentation

## 2020-05-24 DIAGNOSIS — M545 Low back pain: Secondary | ICD-10-CM | POA: Insufficient documentation

## 2020-05-25 NOTE — Progress Notes (Signed)
Please let the patient know that x-ray of lumbar spine shows some degenerative disc disease with spurring at the thoracic/lumbar spine. His shoulder x-ray was normal. Thanks.

## 2020-05-25 NOTE — Progress Notes (Signed)
Pt NOTIFIED.

## 2020-06-09 ENCOUNTER — Other Ambulatory Visit: Payer: Self-pay

## 2020-06-09 MED ORDER — METOPROLOL SUCCINATE ER 100 MG PO TB24: 100.0000 mg | ORAL_TABLET | Freq: Every day | ORAL | 6 refills | Status: DC

## 2020-06-21 ENCOUNTER — Other Ambulatory Visit: Payer: Self-pay

## 2020-06-21 DIAGNOSIS — E1165 Type 2 diabetes mellitus with hyperglycemia: Secondary | ICD-10-CM

## 2020-06-21 MED ORDER — METFORMIN HCL 1000 MG PO TABS: ORAL_TABLET | ORAL | 5 refills | Status: DC

## 2020-06-30 ENCOUNTER — Ambulatory Visit: Payer: Medicaid Other | Admitting: Dermatology

## 2020-07-05 ENCOUNTER — Other Ambulatory Visit: Payer: Self-pay | Admitting: Adult Health

## 2020-07-07 ENCOUNTER — Other Ambulatory Visit: Payer: Self-pay

## 2020-07-07 DIAGNOSIS — E1165 Type 2 diabetes mellitus with hyperglycemia: Secondary | ICD-10-CM

## 2020-07-07 MED ORDER — GLIMEPIRIDE 4 MG PO TABS: 4.0000 mg | ORAL_TABLET | Freq: Two times a day (BID) | ORAL | 3 refills | Status: DC

## 2020-07-14 ENCOUNTER — Other Ambulatory Visit: Payer: Self-pay | Admitting: Adult Health

## 2020-07-14 DIAGNOSIS — I10 Essential (primary) hypertension: Secondary | ICD-10-CM

## 2020-07-20 ENCOUNTER — Other Ambulatory Visit: Payer: Self-pay | Admitting: Adult Health

## 2020-08-18 ENCOUNTER — Telehealth: Payer: Self-pay

## 2020-08-18 NOTE — Telephone Encounter (Signed)
No VM setup for OV on 9/7

## 2020-08-23 ENCOUNTER — Ambulatory Visit: Payer: Medicaid Other | Admitting: Hospice and Palliative Medicine

## 2020-08-24 ENCOUNTER — Other Ambulatory Visit: Payer: Self-pay

## 2020-08-24 DIAGNOSIS — M150 Primary generalized (osteo)arthritis: Secondary | ICD-10-CM

## 2020-08-24 MED ORDER — MELOXICAM 7.5 MG PO TABS: 7.5000 mg | ORAL_TABLET | Freq: Two times a day (BID) | ORAL | 3 refills | Status: DC

## 2020-08-25 ENCOUNTER — Other Ambulatory Visit: Payer: Self-pay

## 2020-08-25 ENCOUNTER — Telehealth: Payer: Self-pay

## 2020-08-25 MED ORDER — GABAPENTIN 300 MG PO CAPS: ORAL_CAPSULE | ORAL | 0 refills | Status: DC

## 2020-08-25 NOTE — Telephone Encounter (Signed)
Confirmed and screened for 08-30-20 ov. 

## 2020-08-30 ENCOUNTER — Other Ambulatory Visit: Payer: Self-pay

## 2020-08-30 ENCOUNTER — Encounter: Payer: Self-pay | Admitting: Hospice and Palliative Medicine

## 2020-08-30 ENCOUNTER — Ambulatory Visit: Payer: Medicaid Other | Admitting: Hospice and Palliative Medicine

## 2020-08-30 VITALS — BP 126/82 | HR 95 | Temp 98.3°F | Resp 16 | Ht 71.0 in | Wt 209.4 lb

## 2020-08-30 DIAGNOSIS — Z0001 Encounter for general adult medical examination with abnormal findings: Secondary | ICD-10-CM

## 2020-08-30 DIAGNOSIS — I10 Essential (primary) hypertension: Secondary | ICD-10-CM

## 2020-08-30 DIAGNOSIS — M542 Cervicalgia: Secondary | ICD-10-CM | POA: Diagnosis not present

## 2020-08-30 DIAGNOSIS — Z23 Encounter for immunization: Secondary | ICD-10-CM

## 2020-08-30 DIAGNOSIS — E1165 Type 2 diabetes mellitus with hyperglycemia: Secondary | ICD-10-CM

## 2020-08-30 DIAGNOSIS — G8929 Other chronic pain: Secondary | ICD-10-CM

## 2020-08-30 LAB — POCT GLYCOSYLATED HEMOGLOBIN (HGB A1C): Hemoglobin A1C: 8.3 % — AB (ref 4.0–5.6)
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
## 2020-10-05 ENCOUNTER — Other Ambulatory Visit: Payer: Self-pay

## 2020-10-05 DIAGNOSIS — E1165 Type 2 diabetes mellitus with hyperglycemia: Secondary | ICD-10-CM

## 2020-10-05 MED ORDER — OMEPRAZOLE 40 MG PO CPDR: DELAYED_RELEASE_CAPSULE | ORAL | 7 refills | Status: DC

## 2020-10-05 MED ORDER — GLIMEPIRIDE 4 MG PO TABS: 4.0000 mg | ORAL_TABLET | Freq: Two times a day (BID) | ORAL | 3 refills | Status: DC

## 2020-10-06 ENCOUNTER — Other Ambulatory Visit: Payer: Self-pay

## 2020-10-06 DIAGNOSIS — I10 Essential (primary) hypertension: Secondary | ICD-10-CM

## 2020-10-06 MED ORDER — HYDROCHLOROTHIAZIDE 12.5 MG PO TABS: 12.5000 mg | ORAL_TABLET | Freq: Every day | ORAL | 3 refills | Status: DC

## 2020-10-10 ENCOUNTER — Ambulatory Visit (INDEPENDENT_AMBULATORY_CARE_PROVIDER_SITE_OTHER): Payer: Medicaid Other | Admitting: Dermatology

## 2020-10-10 ENCOUNTER — Other Ambulatory Visit: Payer: Self-pay

## 2020-10-10 DIAGNOSIS — L2089 Other atopic dermatitis: Secondary | ICD-10-CM | POA: Diagnosis not present

## 2020-10-10 DIAGNOSIS — L859 Epidermal thickening, unspecified: Secondary | ICD-10-CM | POA: Diagnosis not present

## 2020-10-10 MED ORDER — EUCRISA 2 % EX OINT: 1.0000 "application " | TOPICAL_OINTMENT | Freq: Every day | CUTANEOUS | 3 refills | Status: DC

## 2020-10-10 MED ORDER — GABAPENTIN 300 MG PO CAPS: ORAL_CAPSULE | ORAL | 0 refills | Status: DC

## 2020-10-10 NOTE — Progress Notes (Signed)
   Follow-Up Visit   Subjective  Joseph Stark is a 59 y.o. male who presents for the following: Other (History of hyperkertosis with excoriations of legs - right leg is almost completely healed).  The following portions of the chart were reviewed this encounter and updated as appropriate:  Tobacco  Allergies  Meds  Problems  Med Hx  Surg Hx  Fam Hx     Review of Systems:  No other skin or systemic complaints except as noted in HPI or Assessment and Plan.  Objective  Well appearing patient in no apparent distress; mood and affect are within normal limits.  A focused examination was performed including bilateral legs. Relevant physical exam findings are noted in the Assessment and Plan.  Objective  Bilateral lower legs: Few brown hyperkeratotic patches with a few excoriations.  Images        Objective  Bilateral lower legs: Pinkness of lower legs.   Assessment & Plan  Hyperkeratosis with very sensitive skin of the lower legs Bilateral lower legs With excoriations  Continue Mupirocin ointment to any open areas.  Continue to wash with Dove soap and apply Cerave cream daily.  Continue AmLactin lotion daily.  Other atopic dermatitis Bilateral lower legs Start Eucrisa ointment qhs - samples given. If Georga Hacking is not covered will plan Protopic or Elidel.  Crisaborole (EUCRISA) 2 % OINT - Bilateral lower legs  Return in about 3 months (around 01/10/2021).   I, Ashok Cordia, CMA, am acting as scribe for Sarina Ser, MD .  Documentation: I have reviewed the above documentation for accuracy and completeness, and I agree with the above.  Sarina Ser, MD

## 2020-10-11 ENCOUNTER — Encounter: Payer: Self-pay | Admitting: Dermatology

## 2020-10-21 ENCOUNTER — Other Ambulatory Visit: Payer: Self-pay | Admitting: Nurse Practitioner

## 2020-10-21 ENCOUNTER — Other Ambulatory Visit (HOSPITAL_COMMUNITY): Payer: Self-pay | Admitting: Nurse Practitioner

## 2020-10-21 DIAGNOSIS — M5412 Radiculopathy, cervical region: Secondary | ICD-10-CM

## 2020-10-31 ENCOUNTER — Other Ambulatory Visit: Payer: Self-pay

## 2020-10-31 DIAGNOSIS — E1165 Type 2 diabetes mellitus with hyperglycemia: Secondary | ICD-10-CM

## 2020-10-31 MED ORDER — METOPROLOL SUCCINATE ER 100 MG PO TB24: 100.0000 mg | ORAL_TABLET | Freq: Every day | ORAL | 6 refills | Status: DC

## 2020-10-31 MED ORDER — OZEMPIC (1 MG/DOSE) 2 MG/1.5ML ~~LOC~~ SOPN: PEN_INJECTOR | SUBCUTANEOUS | 3 refills | Status: DC

## 2020-11-08 ENCOUNTER — Other Ambulatory Visit: Payer: Self-pay

## 2020-11-08 ENCOUNTER — Ambulatory Visit
Admission: RE | Admit: 2020-11-08 | Discharge: 2020-11-08 | Disposition: A | Discharge status: Home / Self Care | Payer: Medicaid Other | Source: Ambulatory Visit | Attending: Nurse Practitioner | Admitting: Nurse Practitioner

## 2020-11-08 DIAGNOSIS — M5412 Radiculopathy, cervical region: Secondary | ICD-10-CM | POA: Insufficient documentation

## 2020-11-14 ENCOUNTER — Other Ambulatory Visit: Payer: Self-pay

## 2020-11-14 DIAGNOSIS — I10 Essential (primary) hypertension: Secondary | ICD-10-CM

## 2020-11-14 MED ORDER — GABAPENTIN 300 MG PO CAPS: ORAL_CAPSULE | ORAL | 0 refills | Status: DC

## 2020-11-14 MED ORDER — HYDROCHLOROTHIAZIDE 12.5 MG PO TABS: 12.5000 mg | ORAL_TABLET | Freq: Every day | ORAL | 3 refills | Status: DC

## 2020-11-24 ENCOUNTER — Encounter: Payer: Medicaid Other | Admitting: Hospice and Palliative Medicine

## 2020-12-14 ENCOUNTER — Ambulatory Visit (INDEPENDENT_AMBULATORY_CARE_PROVIDER_SITE_OTHER): Payer: Medicaid Other | Admitting: Hospice and Palliative Medicine

## 2020-12-14 ENCOUNTER — Encounter: Payer: Self-pay | Admitting: Hospice and Palliative Medicine

## 2020-12-14 ENCOUNTER — Other Ambulatory Visit: Payer: Self-pay

## 2020-12-14 VITALS — BP 138/72 | HR 80 | Temp 97.7°F | Resp 16 | Ht 71.0 in | Wt 215.0 lb

## 2020-12-14 DIAGNOSIS — E1165 Type 2 diabetes mellitus with hyperglycemia: Secondary | ICD-10-CM

## 2020-12-14 DIAGNOSIS — Z1211 Encounter for screening for malignant neoplasm of colon: Secondary | ICD-10-CM

## 2020-12-14 DIAGNOSIS — Z0001 Encounter for general adult medical examination with abnormal findings: Secondary | ICD-10-CM

## 2020-12-14 DIAGNOSIS — Z79899 Other long term (current) drug therapy: Secondary | ICD-10-CM | POA: Diagnosis not present

## 2020-12-14 DIAGNOSIS — M542 Cervicalgia: Secondary | ICD-10-CM

## 2020-12-14 DIAGNOSIS — Z125 Encounter for screening for malignant neoplasm of prostate: Secondary | ICD-10-CM

## 2020-12-14 DIAGNOSIS — I10 Essential (primary) hypertension: Secondary | ICD-10-CM

## 2020-12-14 DIAGNOSIS — G8929 Other chronic pain: Secondary | ICD-10-CM

## 2020-12-14 LAB — POCT GLYCOSYLATED HEMOGLOBIN (HGB A1C): Hemoglobin A1C: 7.9 % — AB (ref 4.0–5.6)

## 2020-12-14 MED ORDER — DAPAGLIFLOZIN PROPANEDIOL 5 MG PO TABS: 5.0000 mg | ORAL_TABLET | Freq: Every day | ORAL | 3 refills | Status: DC

## 2020-12-14 NOTE — Progress Notes (Signed)
[facility] [place], [place] [address]  Internal MEDICINE  Office Visit Note  Patient Name: Joseph Stark  [id number]  [id number]  Date of Service: 12/18/2020  Chief Complaint  Patient presents with  . Annual Exam    Numbness in the back of head, pain in both shoulders started at the beginning of December   . Diabetes  . Hypertension  . Quality Metric Gaps    Eye exam scheduled      HPI Pt is here for routine health maintenance examination Complaining of neck pain--was seen by [facility] neurosurgery, CT found cord compression IMPRESSION:  1. Multilevel degenerative disease with mild scoliosis.  2. 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 Severe biforaminal  impingement, greater on the left.  3. C3-4 left foraminal impingement. C6-7 moderate leftforaminal  narrowing.  4. C5-6 ACDF with solid arthrodesis.  Recommended surgery--he declined as he has had neck surgery in the past and had multiple complications  DM-monitors his glucose levels 3 times a day, averages 150-200--tries to eat healthy, does not get routine exercise due to chronic pain  Chronic leg wounds, followed closely by dermatology, wounds healed at this time  Needs to redo ColoGuard has been three years since last testing Has had eye exam this year but cannot remember the month    Current Medication: Outpatient Encounter Medications as of 12/14/2020  Medication Sig  . dapagliflozin propanediol (FARXIGA) 5 MG TABS tablet Take 1 tablet (5 mg total) by mouth daily.  . Accu-Chek Softclix Lancets lancets USE AS DIRECTED TO CHECK BLOOD SUGARS THREE TIMES A DAY DX CODE E11.9  . Crisaborole (EUCRISA) 2 % OINT Apply 1 application topically at bedtime.  . fenofibrate (TRICOR) 145 MG tablet Take 145 mg by mouth daily.  . fexofenadine (ALLEGRA) 180 MG tablet Take 180 mg by mouth daily.  Marland Kitchen gabapentin (NEURONTIN) 300 MG capsule TAKE ONE CAPSULE BY MOUTH EVERY MORNING, 1 AT  MIDDAY AND 2 AT AT BEDTIME  . glimepiride (AMARYL) 4 MG tablet Take 1 tablet (4 mg total) by mouth 2 (two) times daily.  Marland Kitchen glucose blood (ACCU-CHEK AVIVA PLUS) test strip CHECK BLOOD SUGARS 3 TIMES A DAY, DX- UNCONTROLLED DIABETES  . hydrochlorothiazide (HYDRODIURIL) 12.5 MG tablet Take 1 tablet (12.5 mg total) by mouth daily.  . meloxicam (MOBIC) 7.5 MG tablet Take 1 tablet (7.5 mg total) by mouth 2 (two) times daily.  . metFORMIN (GLUCOPHAGE) 1000 MG tablet Take 1 tablet po QAM and take 1/2 tablet po QPM  . metoprolol succinate (TOPROL-XL) 100 MG 24 hr tablet Take 1 tablet (100 mg total) by mouth daily. Take with or immediately following a meal.  . morphine (MSIR) 30 MG tablet Take 30 mg by mouth every 12 (twelve) hours.  Marland Kitchen omeprazole (PRILOSEC) 40 MG capsule TAKE ONE CAPSULE DAILY FOR HEARTBURN  . pneumococcal 23 valent vaccine (PNEUMOVAX 23) 25 MCG/0.5ML injection Inject 0.15ml IM once  . pravastatin (PRAVACHOL) 40 MG tablet TAKE 1 TABLET BY MOUTH EVERYDAY AT BEDTIME  . promethazine (PHENERGAN) 12.5 MG tablet Take 12.5 mg by mouth 2 (two) times daily as needed for nausea or vomiting.  . ramipril (ALTACE) 10 MG capsule TAKE 1 CAPSULE BY MOUTH EVERY DAY  . Semaglutide, 1 MG/DOSE, (OZEMPIC, 1 MG/DOSE,) 2 MG/1.5ML SOPN Inject 1 MG into skin once a week  . [DISCONTINUED] beclomethasone (QVAR) 40 MCG/ACT inhaler Inhale 2 puffs into the lungs 2 (two) times daily.  . [DISCONTINUED] clobetasol cream (TEMOVATE) [id number] % Apply 1 application  topically 2 (two) times daily.  . [DISCONTINUED] nystatin-triamcinolone (MYCOLOG II) cream Apply 1 application topically 2 (two) times daily.   No facility-administered encounter medications on file as of 12/14/2020.    Surgical History: History reviewed. No pertinent surgical history.  Medical History: Past Medical History:  Diagnosis Date  . Compressed cervical disc   . Diabetes mellitus without complication ([place])   . Hypertension   . Renal disorder   .  Seizures ([place])     Family History: Family History  Problem Relation Age of Onset  . Ovarian cancer Mother   . Lymphoma Father       Review of Systems  Constitutional: Negative for chills, fatigue and unexpected weight change.  HENT: Negative for congestion, postnasal drip, rhinorrhea, sneezing and sore throat.   Eyes: Negative for redness.  Respiratory: Negative for cough, chest tightness and shortness of breath.   Cardiovascular: Negative for chest pain and palpitations.  Gastrointestinal: Negative for abdominal pain, constipation, diarrhea, nausea and vomiting.  Genitourinary: Negative for dysuria and frequency.  Musculoskeletal: Positive for arthralgias and neck pain. Negative for back pain and joint swelling.  Skin: Negative for rash.  Neurological: Positive for numbness. Negative for tremors.       Intermittent numbness to bilateral arms, radiating for cervical spine pain  Hematological: Negative for adenopathy. Does not bruise/bleed easily.  Psychiatric/Behavioral: Negative for behavioral problems (Depression), sleep disturbance and suicidal ideas. The patient is not nervous/anxious.      Vital Signs: BP 138/72   Pulse 80   Temp 97.7 F (36.5 C)   Resp 16   Ht 5\' 11"  (1.803 m)   Wt 215 lb (97.5 kg)   SpO2 98%   BMI 29.99 kg/m    Physical Exam Vitals reviewed.  Constitutional:      Appearance: Normal appearance. He is obese.  HENT:     Right Ear: Tympanic membrane normal.     Left Ear: Tympanic membrane normal.     Nose: Nose normal.     Mouth/Throat:     Mouth: Mucous membranes are moist.     Pharynx: Oropharynx is clear.  Eyes:     Pupils: Pupils are equal, round, and reactive to light.  Cardiovascular:     Rate and Rhythm: Normal rate and regular rhythm.     Pulses: Normal pulses.          Dorsalis pedis pulses are 2+ on the right side and 2+ on the left side.       Posterior tibial pulses are 2+ on the right side and 2+ on the left side.     Heart  sounds: Normal heart sounds.  Pulmonary:     Effort: Pulmonary effort is normal.     Breath sounds: Normal breath sounds.  Abdominal:     General: Abdomen is flat.     Palpations: Abdomen is soft.  Musculoskeletal:        General: Normal range of motion.     Cervical back: Normal range of motion.  Feet:     Right foot:     Protective Sensation: 5 sites tested. 5 sites sensed.     Skin integrity: Skin integrity normal.     Toenail Condition: Right toenails are normal.     Left foot:     Protective Sensation: 5 sites tested. 5 sites sensed.     Skin integrity: Skin integrity normal.     Toenail Condition: Left toenails are normal.  Skin:    General: Skin  is warm.  Neurological:     General: No focal deficit present.     Mental Status: He is alert and oriented to person, place, and time. Mental status is at baseline.  Psychiatric:        Mood and Affect: Mood normal.        Behavior: Behavior normal.        Thought Content: Thought content normal.        Judgment: Judgment normal.   LABS: Recent Results (from the past 2160 hour(s))  POCT HgB A1C     Status: Abnormal   Collection Time: 12/14/20  9:19 AM  Result Value Ref Range   Hemoglobin A1C 7.9 (A) 4.0 - 5.6 %   HbA1c POC (<> result, manual entry)     HbA1c, POC (prediabetic range)     HbA1c, POC (controlled diabetic range)      Assessment/Plan: 1. Encounter for routine adult health examination with abnormal findings Well appearing 59 year old male Will review routine labs and adjust therapy as indicated Will set up for ColoGuard - CBC w/Diff/Platelet - Comprehensive Metabolic Panel (CMET) - Lipid Panel With LDL/HDL Ratio - TSH + free T4 - PSA  2. Type 2 diabetes mellitus with hyperglycemia, without long-term current use of insulin (HCC) A1C elevated at 7.9 today, will add low dose Farxiga to regimen Continue to monitor A1C--may need to increase dose of Farxiga at next visit - POCT HgB A1C - Comprehensive  Metabolic Panel (CMET) - dapagliflozin propanediol (FARXIGA) 5 MG TABS tablet; Take 1 tablet (5 mg total) by mouth daily.  Dispense: 30 tablet; Refill: 3  3. High risk medications (not anticoagulants) long-term use - CBC w/Diff/Platelet - Comprehensive Metabolic Panel (CMET) - Lipid Panel With LDL/HDL Ratio - TSH + free T4  4. Screening for colon cancer Will have set up to repeat ColoGuard testing, discussed benefits of considering colonoscopy screening as well, he declines at this time and wishes to proceed with ColoGuard  5. Essential hypertension BP and HR well controlled on current therapy, continue to monitor closely  6. Chronic neck pain Cord compression found on recent imaging Advised he needs to at least have an appointment to discuss options, also managed by pain management  7. Screening for prostate cancer - PSA  General Counseling: renwick asman understanding of the findings of todays visit and agrees with plan of treatment. I have discussed any further diagnostic evaluation that may be needed or ordered today. We also reviewed his medications today. he has been encouraged to call the office with any questions or concerns that should arise related to todays visit.    Counseling:    Orders Placed This Encounter  Procedures  . CBC w/Diff/Platelet  . Comprehensive Metabolic Panel (CMET)  . Lipid Panel With LDL/HDL Ratio  . TSH + free T4  . PSA  . POCT HgB A1C    Meds ordered this encounter  Medications  . dapagliflozin propanediol (FARXIGA) 5 MG TABS tablet    Sig: Take 1 tablet (5 mg total) by mouth daily.    Dispense:  30 tablet    Refill:  3    Total time spent: 30 Minutes  Time spent includes review of chart, medications, test results, and follow up plan with the patient.   This patient was seen by Brent General AGNP-C Collaboration with Dr Lyndon Code as a part of collaborative care agreement   Lubertha Basque. [facility] Internal Medicine

## 2020-12-18 ENCOUNTER — Encounter: Payer: Self-pay | Admitting: Hospice and Palliative Medicine

## 2020-12-22 ENCOUNTER — Other Ambulatory Visit: Payer: Self-pay

## 2020-12-22 DIAGNOSIS — L2089 Other atopic dermatitis: Secondary | ICD-10-CM

## 2020-12-22 MED ORDER — EUCRISA 2 % EX OINT: 1.0000 "application " | TOPICAL_OINTMENT | Freq: Every day | CUTANEOUS | 5 refills | Status: DC

## 2020-12-23 ENCOUNTER — Telehealth: Payer: Self-pay

## 2020-12-23 NOTE — Telephone Encounter (Signed)
Faxed over patient's cologuard requisiton order form on 12/23/2020.

## 2020-12-26 ENCOUNTER — Other Ambulatory Visit: Payer: Self-pay

## 2020-12-26 MED ORDER — RAMIPRIL 10 MG PO CAPS: 10.0000 mg | ORAL_CAPSULE | Freq: Every day | ORAL | 3 refills | Status: DC

## 2020-12-26 MED ORDER — GABAPENTIN 300 MG PO CAPS: ORAL_CAPSULE | ORAL | 0 refills | Status: DC

## 2021-01-06 ENCOUNTER — Other Ambulatory Visit: Payer: Self-pay | Admitting: Hospice and Palliative Medicine

## 2021-01-06 ENCOUNTER — Other Ambulatory Visit: Payer: Self-pay

## 2021-01-06 DIAGNOSIS — M150 Primary generalized (osteo)arthritis: Secondary | ICD-10-CM

## 2021-01-06 DIAGNOSIS — E1165 Type 2 diabetes mellitus with hyperglycemia: Secondary | ICD-10-CM

## 2021-01-06 MED ORDER — METFORMIN HCL 1000 MG PO TABS: ORAL_TABLET | ORAL | 5 refills | Status: DC

## 2021-01-06 MED ORDER — MELOXICAM 7.5 MG PO TABS: 7.5000 mg | ORAL_TABLET | Freq: Two times a day (BID) | ORAL | 3 refills | Status: DC

## 2021-01-11 LAB — COLOGUARD: COLOGUARD: NEGATIVE

## 2021-01-16 ENCOUNTER — Ambulatory Visit: Payer: Medicaid Other | Admitting: Dermatology

## 2021-01-17 LAB — COLOGUARD

## 2021-01-29 ENCOUNTER — Other Ambulatory Visit: Payer: Self-pay | Admitting: Hospice and Palliative Medicine

## 2021-01-30 ENCOUNTER — Other Ambulatory Visit: Payer: Self-pay

## 2021-01-30 DIAGNOSIS — E1165 Type 2 diabetes mellitus with hyperglycemia: Secondary | ICD-10-CM

## 2021-01-30 MED ORDER — GLIMEPIRIDE 4 MG PO TABS: 4.0000 mg | ORAL_TABLET | Freq: Two times a day (BID) | ORAL | 3 refills | Status: DC

## 2021-01-30 MED ORDER — PRAVASTATIN SODIUM 40 MG PO TABS: ORAL_TABLET | ORAL | 6 refills | Status: DC

## 2021-02-02 ENCOUNTER — Telehealth: Payer: Self-pay

## 2021-02-02 NOTE — Telephone Encounter (Signed)
Formed pt of negative cologuard results

## 2021-02-08 ENCOUNTER — Other Ambulatory Visit: Payer: Self-pay | Admitting: Nurse Practitioner

## 2021-02-08 DIAGNOSIS — E1165 Type 2 diabetes mellitus with hyperglycemia: Secondary | ICD-10-CM

## 2021-02-17 ENCOUNTER — Other Ambulatory Visit: Payer: Self-pay

## 2021-02-17 DIAGNOSIS — I10 Essential (primary) hypertension: Secondary | ICD-10-CM

## 2021-02-17 MED ORDER — HYDROCHLOROTHIAZIDE 12.5 MG PO TABS: 12.5000 mg | ORAL_TABLET | Freq: Every day | ORAL | 3 refills | Status: DC

## 2021-02-20 ENCOUNTER — Other Ambulatory Visit: Payer: Self-pay | Admitting: Nurse Practitioner

## 2021-02-27 ENCOUNTER — Ambulatory Visit (INDEPENDENT_AMBULATORY_CARE_PROVIDER_SITE_OTHER): Payer: Medicaid Other | Admitting: Dermatology

## 2021-03-14 ENCOUNTER — Ambulatory Visit: Payer: Medicaid Other | Admitting: Hospice and Palliative Medicine

## 2021-03-14 ENCOUNTER — Encounter: Payer: Self-pay | Admitting: Hospice and Palliative Medicine

## 2021-03-14 VITALS — BP 136/84 | HR 80 | Temp 97.5°F | Resp 16 | Ht 71.0 in | Wt 207.4 lb

## 2021-03-14 DIAGNOSIS — J302 Other seasonal allergic rhinitis: Secondary | ICD-10-CM | POA: Diagnosis not present

## 2021-03-14 DIAGNOSIS — I10 Essential (primary) hypertension: Secondary | ICD-10-CM | POA: Diagnosis not present

## 2021-03-14 DIAGNOSIS — E1165 Type 2 diabetes mellitus with hyperglycemia: Secondary | ICD-10-CM

## 2021-03-14 DIAGNOSIS — M542 Cervicalgia: Secondary | ICD-10-CM | POA: Diagnosis not present

## 2021-03-14 DIAGNOSIS — G8929 Other chronic pain: Secondary | ICD-10-CM

## 2021-03-14 LAB — POCT GLYCOSYLATED HEMOGLOBIN (HGB A1C): Hemoglobin A1C: 7.3 % — AB (ref 4.0–5.6)

## 2021-03-14 MED ORDER — DAPAGLIFLOZIN PROPANEDIOL 10 MG PO TABS: 10.0000 mg | ORAL_TABLET | Freq: Every day | ORAL | 0 refills | Status: DC

## 2021-03-14 NOTE — Progress Notes (Signed)
[facility] [place], [place] [address]  Internal MEDICINE  Office Visit Note  Patient Name: Joseph Stark  [id number]  [id number]  Date of Service: 03/15/2021  Chief Complaint  Patient presents with  . Follow-up    Pt fell last week and hurt right knee  . Hypertension  . Diabetes  . Quality Metric Gaps    Eye exam    HPI Patient is here for routine follow-up Had Cologuard testing--negative results Has not been back to see neurosurgery for his cord compression or neck pain--again wanting to avoid surgical procedures [employer] outside last week and landed on his right knee--joint was swollen for a few days but has since resolved and pain has improved Offered imaging but he declines at this time  Started on low dose Farxiga at our last visit--tolerating well  Has not yet been able to have his labs drawn from last visit  Current Medication: Outpatient Encounter Medications as of 03/14/2021  Medication Sig  . dapagliflozin propanediol (FARXIGA) 10 MG TABS tablet Take 1 tablet (10 mg total) by mouth daily before breakfast.  . Accu-Chek Softclix Lancets lancets USE AS DIRECTED TO CHECK BLOOD SUGARS THREE TIMES A DAY DX CODE E11.9  . Crisaborole (EUCRISA) 2 % OINT Apply 1 application topically at bedtime.  . fenofibrate (TRICOR) 145 MG tablet Take 145 mg by mouth daily.  . fexofenadine (ALLEGRA) 180 MG tablet Take 180 mg by mouth daily.  Marland Kitchen gabapentin (NEURONTIN) 300 MG capsule TAKE ONE CAPSULE BY MOUTH EVERY MORNING, 1 AT MIDDAY AND 2 AT AT BEDTIME  . glimepiride (AMARYL) 4 MG tablet Take 1 tablet (4 mg total) by mouth 2 (two) times daily.  Marland Kitchen glucose blood (ACCU-CHEK AVIVA PLUS) test strip CHECK BLOOD SUGARS 3 TIMES A DAY  . hydrochlorothiazide (HYDRODIURIL) 12.5 MG tablet Take 1 tablet (12.5 mg total) by mouth daily.  . meloxicam (MOBIC) 7.5 MG tablet Take 1 tablet (7.5 mg total) by mouth 2 (two) times daily.  . metFORMIN (GLUCOPHAGE) 1000 MG tablet Take  1 tablet po QAM and take 1/2 tablet po QPM  . metoprolol succinate (TOPROL-XL) 100 MG 24 hr tablet Take 1 tablet (100 mg total) by mouth daily. Take with or immediately following a meal.  . morphine (MSIR) 30 MG tablet Take 30 mg by mouth every 12 (twelve) hours.  Marland Kitchen omeprazole (PRILOSEC) 40 MG capsule TAKE ONE CAPSULE DAILY FOR HEARTBURN  . pneumococcal 23 valent vaccine (PNEUMOVAX 23) 25 MCG/0.5ML injection Inject 0.45ml IM once  . pravastatin (PRAVACHOL) 40 MG tablet TAKE 1 TABLET BY MOUTH EVERYDAY AT BEDTIME  . promethazine (PHENERGAN) 12.5 MG tablet Take 12.5 mg by mouth 2 (two) times daily as needed for nausea or vomiting.  . ramipril (ALTACE) 10 MG capsule Take 1 capsule (10 mg total) by mouth daily.  . Semaglutide, 1 MG/DOSE, (OZEMPIC, 1 MG/DOSE,) 2 MG/1.5ML SOPN Inject 1 MG into skin once a week  . [DISCONTINUED] dapagliflozin propanediol (FARXIGA) 5 MG TABS tablet Take 1 tablet (5 mg total) by mouth daily.   No facility-administered encounter medications on file as of 03/14/2021.    Surgical History: History reviewed. No pertinent surgical history.  Medical History: Past Medical History:  Diagnosis Date  . Compressed cervical disc   . Diabetes mellitus without complication ([place])   . Hypertension   . Renal disorder   . Seizures ([place])     Family History: Family History  Problem Relation Age of Onset  . Ovarian cancer Mother   .  Lymphoma Father     Social History   Socioeconomic History  . Marital status: Single    Spouse name: Not on file  . Number of children: Not on file  . Years of education: Not on file  . Highest education level: Not on file  Occupational History  . Not on file  Tobacco Use  . Smoking status: Former Smoker    Types: Cigarettes  . Smokeless tobacco: Never Used  . Tobacco comment: quit 30 years ago  Vaping Use  . Vaping Use: Never used  Substance and Sexual Activity  . Alcohol use: No  . Drug use: No  . Sexual activity: Not Currently   Other Topics Concern  . Not on file  Social History Narrative  . Not on file   Social Determinants of Health   Financial Resource Strain: Not on file  Food Insecurity: Not on file  Transportation Needs: Not on file  Physical Activity: Not on file  Stress: Not on file  Social Connections: Not on file  Intimate Partner Violence: Not on file      Review of Systems  Constitutional: Negative for chills, fatigue and unexpected weight change.  HENT: Negative for congestion, postnasal drip, rhinorrhea, sneezing and sore throat.   Eyes: Negative for redness.  Respiratory: Negative for cough, chest tightness and shortness of breath.   Cardiovascular: Negative for chest pain and palpitations.  Gastrointestinal: Negative for abdominal pain, constipation, diarrhea, nausea and vomiting.  Genitourinary: Negative for dysuria and frequency.  Musculoskeletal: Negative for arthralgias, back pain, joint swelling and neck pain.  Skin: Negative for rash.  Neurological: Negative for tremors and numbness.  Hematological: Negative for adenopathy. Does not bruise/bleed easily.  Psychiatric/Behavioral: Negative for behavioral problems (Depression), sleep disturbance and suicidal ideas. The patient is not nervous/anxious.     Vital Signs: BP 136/84   Pulse 80   Temp (!) 97.5 F (36.4 C)   Resp 16   Ht 5\' 11"  (1.803 m)   Wt 207 lb 6.4 oz (94.1 kg)   SpO2 98%   BMI 28.93 kg/m    Physical Exam Vitals reviewed.  Constitutional:      Appearance: Normal appearance. He is normal weight.  Cardiovascular:     Rate and Rhythm: Normal rate and regular rhythm.     Pulses: Normal pulses.     Heart sounds: Normal heart sounds.  Pulmonary:     Effort: Pulmonary effort is normal.     Breath sounds: Normal breath sounds.  Abdominal:     General: Abdomen is flat.     Palpations: Abdomen is soft.  Musculoskeletal:        General: Normal range of motion.     Cervical back: Normal range of motion.   Skin:    General: Skin is warm.  Neurological:     General: No focal deficit present.     Mental Status: He is alert and oriented to person, place, and time. Mental status is at baseline.  Psychiatric:        Mood and Affect: Mood normal.        Behavior: Behavior normal.        Thought Content: Thought content normal.        Judgment: Judgment normal.    Assessment/Plan: 1. Type 2 diabetes mellitus with hyperglycemia, without long-term current use of insulin (HCC) A1C 7.3 today, improvement since last check, will increase dose of Farxiga to further glucose control - POCT HgB A1C - dapagliflozin propanediol (FARXIGA)  10 MG TABS tablet; Take 1 tablet (10 mg total) by mouth daily before breakfast.  Dispense: 90 tablet; Refill: 0  2. Essential hypertension BP and HR well controlled, continue to monitor and current management  3. Chronic neck pain Followed by pain management, again encouraged to follow-up with neurosurgery due to cord compression  4. Seasonal allergies Well controlled on current management, continue to monitor  General Counseling: pearlie lafosse understanding of the findings of todays visit and agrees with plan of treatment. I have discussed any further diagnostic evaluation that may be needed or ordered today. We also reviewed his medications today. he has been encouraged to call the office with any questions or concerns that should arise related to todays visit.    Orders Placed This Encounter  Procedures  . POCT HgB A1C    Meds ordered this encounter  Medications  . dapagliflozin propanediol (FARXIGA) 10 MG TABS tablet    Sig: Take 1 tablet (10 mg total) by mouth daily before breakfast.    Dispense:  90 tablet    Refill:  0    Time spent: 30 Minutes Time spent includes review of chart, medications, test results and follow-up plan with the patient.  This patient was seen by Theodoro Grist AGNP-C in Collaboration with Dr Lavera Guise as a part of  collaborative care agreement     Tanna Furry. Lyndsi Altic AGNP-C Internal medicine

## 2021-03-15 ENCOUNTER — Encounter: Payer: Self-pay | Admitting: Hospice and Palliative Medicine

## 2021-03-20 LAB — COLOGUARD

## 2021-03-27 ENCOUNTER — Other Ambulatory Visit: Payer: Self-pay | Admitting: Nurse Practitioner

## 2021-03-27 DIAGNOSIS — E1165 Type 2 diabetes mellitus with hyperglycemia: Secondary | ICD-10-CM

## 2021-03-29 ENCOUNTER — Other Ambulatory Visit: Payer: Self-pay | Admitting: Nurse Practitioner

## 2021-03-29 DIAGNOSIS — E1165 Type 2 diabetes mellitus with hyperglycemia: Secondary | ICD-10-CM

## 2021-03-29 MED ORDER — ACCU-CHEK SOFTCLIX LANCETS MISC: 6 refills | Status: DC

## 2021-04-03 ENCOUNTER — Other Ambulatory Visit: Payer: Self-pay | Admitting: Hospice and Palliative Medicine

## 2021-04-03 ENCOUNTER — Other Ambulatory Visit: Payer: Self-pay | Admitting: Internal Medicine

## 2021-04-03 ENCOUNTER — Other Ambulatory Visit: Payer: Self-pay | Admitting: Nurse Practitioner

## 2021-05-02 LAB — COLOGUARD

## 2021-06-08 ENCOUNTER — Encounter: Payer: Self-pay | Admitting: Nurse Practitioner

## 2021-06-08 ENCOUNTER — Other Ambulatory Visit: Payer: Self-pay

## 2021-06-08 ENCOUNTER — Ambulatory Visit: Payer: Medicaid Other | Admitting: Nurse Practitioner

## 2021-06-08 VITALS — BP 138/86 | HR 77 | Temp 98.5°F | Resp 16 | Ht 71.0 in | Wt 205.8 lb

## 2021-06-08 DIAGNOSIS — I10 Essential (primary) hypertension: Secondary | ICD-10-CM

## 2021-06-08 DIAGNOSIS — M150 Primary generalized (osteo)arthritis: Secondary | ICD-10-CM | POA: Diagnosis not present

## 2021-06-08 DIAGNOSIS — K219 Gastro-esophageal reflux disease without esophagitis: Secondary | ICD-10-CM | POA: Diagnosis not present

## 2021-06-08 DIAGNOSIS — M542 Cervicalgia: Secondary | ICD-10-CM

## 2021-06-08 DIAGNOSIS — E1165 Type 2 diabetes mellitus with hyperglycemia: Secondary | ICD-10-CM

## 2021-06-08 DIAGNOSIS — G8929 Other chronic pain: Secondary | ICD-10-CM

## 2021-06-08 LAB — POCT GLYCOSYLATED HEMOGLOBIN (HGB A1C): Hemoglobin A1C: 7 % — AB (ref 4.0–5.6)

## 2021-06-08 MED ORDER — DAPAGLIFLOZIN PROPANEDIOL 10 MG PO TABS: 10.0000 mg | ORAL_TABLET | Freq: Every day | ORAL | 1 refills | Status: DC

## 2021-06-08 MED ORDER — GABAPENTIN 300 MG PO CAPS: ORAL_CAPSULE | ORAL | 1 refills | Status: DC

## 2021-06-08 MED ORDER — MELOXICAM 7.5 MG PO TABS: 7.5000 mg | ORAL_TABLET | Freq: Two times a day (BID) | ORAL | 3 refills | Status: DC

## 2021-06-08 MED ORDER — GLIMEPIRIDE 4 MG PO TABS: 4.0000 mg | ORAL_TABLET | Freq: Two times a day (BID) | ORAL | 3 refills | Status: DC

## 2021-06-08 MED ORDER — OMEPRAZOLE 40 MG PO CPDR: DELAYED_RELEASE_CAPSULE | ORAL | 7 refills | Status: DC

## 2021-06-08 NOTE — Progress Notes (Signed)
[facility] [place], [place] [address]  Internal MEDICINE  Office Visit Note  Patient Name: Joseph Stark  [id number]  [id number]  Date of Service: 06/19/2021  Chief Complaint  Patient presents with   Follow-up    Med refill,    Diabetes    A1C, both feet are starting to be sore on the bottom,    Quality Metric Gaps    Eye exam has been scheduled     [name] presents for a follow up visit for medication refills, A1C and neuropathy. He has a history of diabetes, hypertension, kidney disease, and seizures.  Blood pressure is 138/86. Taking hydrochlorothiazide, metoprolol succinate, and ramipril.  -A1C is 7.0 today which improved from 7.3 in march 2022. He is taking glimepiride, farxiga, ozempic, and metformin. He reports that his feet feel sore on the bottom. He does have neuropathy.  Current Medication: Outpatient Encounter Medications as of 06/08/2021  Medication Sig   Accu-Chek Softclix Lancets lancets USE AS DIRECTED TO CHECK BLOOD SUGARS THREE TIMES A DAY DX CODE E11.9   Crisaborole (EUCRISA) 2 % OINT Apply 1 application topically at bedtime.   dapagliflozin propanediol (FARXIGA) 10 MG TABS tablet Take 1 tablet (10 mg total) by mouth daily before breakfast.   fenofibrate (TRICOR) 145 MG tablet Take 145 mg by mouth daily.   fexofenadine (ALLEGRA) 180 MG tablet Take 180 mg by mouth daily.   gabapentin (NEURONTIN) 300 MG capsule TAKE ONE CAPSULE BY MOUTH EVERY MORNING, 1 AT MIDDAY AND 2 AT AT BEDTIME   glimepiride (AMARYL) 4 MG tablet Take 1 tablet (4 mg total) by mouth 2 (two) times daily.   glucose blood (ACCU-CHEK AVIVA PLUS) test strip CHECK BLOOD SUGARS 3 TIMES A DAY   meloxicam (MOBIC) 7.5 MG tablet Take 1 tablet (7.5 mg total) by mouth 2 (two) times daily.   metFORMIN (GLUCOPHAGE) 1000 MG tablet Take 1 tablet po QAM and take 1/2 tablet po QPM   metoprolol succinate (TOPROL-XL) 100 MG 24 hr tablet Take 1 tablet (100 mg total) by mouth daily.  Take with or immediately following a meal.   morphine (MSIR) 30 MG tablet Take 30 mg by mouth every 12 (twelve) hours.   omeprazole (PRILOSEC) 40 MG capsule TAKE ONE CAPSULE DAILY FOR HEARTBURN   pneumococcal 23 valent vaccine (PNEUMOVAX 23) 25 MCG/0.5ML injection Inject 0.60ml IM once   pravastatin (PRAVACHOL) 40 MG tablet TAKE 1 TABLET BY MOUTH EVERYDAY AT BEDTIME   promethazine (PHENERGAN) 12.5 MG tablet Take 12.5 mg by mouth 2 (two) times daily as needed for nausea or vomiting.   ramipril (ALTACE) 10 MG capsule TAKE 1 CAPSULE BY MOUTH EVERY DAY   [DISCONTINUED] dapagliflozin propanediol (FARXIGA) 10 MG TABS tablet Take 1 tablet (10 mg total) by mouth daily before breakfast.   [DISCONTINUED] gabapentin (NEURONTIN) 300 MG capsule TAKE ONE CAPSULE BY MOUTH EVERY MORNING, 1 AT MIDDAY AND 2 AT AT BEDTIME   [DISCONTINUED] glimepiride (AMARYL) 4 MG tablet Take 1 tablet (4 mg total) by mouth 2 (two) times daily.   [DISCONTINUED] hydrochlorothiazide (HYDRODIURIL) 12.5 MG tablet Take 1 tablet (12.5 mg total) by mouth daily.   [DISCONTINUED] meloxicam (MOBIC) 7.5 MG tablet Take 1 tablet (7.5 mg total) by mouth 2 (two) times daily.   [DISCONTINUED] omeprazole (PRILOSEC) 40 MG capsule TAKE ONE CAPSULE DAILY FOR HEARTBURN   [DISCONTINUED] OZEMPIC, 1 MG/DOSE, 4 MG/3ML SOPN INJECT 1MG  INTO THE SKIN ONCE A WEEK\   No facility-administered encounter medications on file as  of 06/08/2021.    Surgical History: History reviewed. No pertinent surgical history.  Medical History: Past Medical History:  Diagnosis Date   Compressed cervical disc    Diabetes mellitus without complication (HCC)    Hypertension    Renal disorder    Seizures ([place])     Family History: Family History  Problem Relation Age of Onset   Ovarian cancer Mother    Lymphoma Father     Social History   Socioeconomic History   Marital status: Single    Spouse name: Not on file   Number of children: Not on file   Years of  education: Not on file   Highest education level: Not on file  Occupational History   Not on file  Tobacco Use   Smoking status: Former    Pack years: 0.00    Types: Cigarettes   Smokeless tobacco: Never   Tobacco comments:    quit 30 years ago  Vaping Use   Vaping Use: Never used  Substance and Sexual Activity   Alcohol use: No   Drug use: No   Sexual activity: Not Currently  Other Topics Concern   Not on file  Social History Narrative   Not on file   Social Determinants of Health   Financial Resource Strain: Not on file  Food Insecurity: Not on file  Transportation Needs: Not on file  Physical Activity: Not on file  Stress: Not on file  Social Connections: Not on file  Intimate Partner Violence: Not on file      Review of Systems  Constitutional:  Negative for chills, fatigue and unexpected weight change.  HENT:  Negative for congestion, rhinorrhea, sneezing and sore throat.   Eyes:  Negative for redness.  Respiratory:  Negative for cough, chest tightness and shortness of breath.   Cardiovascular:  Negative for chest pain and palpitations.  Gastrointestinal:  Negative for abdominal pain, constipation, diarrhea, nausea and vomiting.  Genitourinary:  Negative for dysuria and frequency.  Musculoskeletal:  Positive for arthralgias and neck pain. Negative for back pain and joint swelling.  Skin:  Negative for rash.  Neurological: Negative.  Negative for tremors and numbness.  Hematological:  Negative for adenopathy. Does not bruise/bleed easily.  Psychiatric/Behavioral:  Negative for behavioral problems (Depression), sleep disturbance and suicidal ideas. The patient is not nervous/anxious.    Vital Signs: BP 138/86   Pulse 77   Temp 98.5 F (36.9 C)   Resp 16   Ht 5\' 11"  (1.803 m)   Wt 205 lb 12.8 oz (93.4 kg)   SpO2 95%   BMI 28.70 kg/m    Physical Exam Vitals reviewed.  Constitutional:      General: He is not in acute distress.    Appearance: Normal  appearance. He is obese. He is not ill-appearing.  HENT:     Head: Normocephalic and atraumatic.  Cardiovascular:     Rate and Rhythm: Normal rate and regular rhythm.     Pulses: Normal pulses.     Heart sounds: Normal heart sounds.  Pulmonary:     Effort: Pulmonary effort is normal.     Breath sounds: Normal breath sounds.  Musculoskeletal:     Cervical back: Normal range of motion and neck supple.  Skin:    General: Skin is warm and dry.     Capillary Refill: Capillary refill takes less than 2 seconds.  Neurological:     Mental Status: He is alert.  Psychiatric:  Mood and Affect: Mood normal.        Behavior: Behavior normal.     Assessment/Plan: 1. Type 2 diabetes mellitus with hyperglycemia, without long-term current use of insulin (HCC) A1C continues to improve, refills ordered, recheck A1C in 3 months.  - POCT glycosylated hemoglobin (Hb A1C) - glimepiride (AMARYL) 4 MG tablet; Take 1 tablet (4 mg total) by mouth 2 (two) times daily.  Dispense: 60 tablet; Refill: 3 - dapagliflozin propanediol (FARXIGA) 10 MG TABS tablet; Take 1 tablet (10 mg total) by mouth daily before breakfast.  Dispense: 90 tablet; Refill: 1  2. Essential hypertension Stable with current medications, no refills needed at this time.   3. Gastro-esophageal reflux disease without esophagitis Stable with omprazole, refill ordered.  - omeprazole (PRILOSEC) 40 MG capsule; TAKE ONE CAPSULE DAILY FOR HEARTBURN  Dispense: 30 capsule; Refill: 7  4. Primary generalized (osteo)arthritis Taking meloxicam for pain, refill ordered.  - meloxicam (MOBIC) 7.5 MG tablet; Take 1 tablet (7.5 mg total) by mouth 2 (two) times daily.  Dispense: 60 tablet; Refill: 3  5. Chronic neck pain Manageable with gabapentin, refill ordered.  - gabapentin (NEURONTIN) 300 MG capsule; TAKE ONE CAPSULE BY MOUTH EVERY MORNING, 1 AT MIDDAY AND 2 AT AT BEDTIME  Dispense: 120 capsule; Refill: 1   General Counseling: Xylan  verbalizes understanding of the findings of todays visit and agrees with plan of treatment. I have discussed any further diagnostic evaluation that may be needed or ordered today. We also reviewed his medications today. he has been encouraged to call the office with any questions or concerns that should arise related to todays visit.    Orders Placed This Encounter  Procedures   POCT glycosylated hemoglobin (Hb A1C)    Meds ordered this encounter  Medications   gabapentin (NEURONTIN) 300 MG capsule    Sig: TAKE ONE CAPSULE BY MOUTH EVERY MORNING, 1 AT MIDDAY AND 2 AT AT BEDTIME    Dispense:  120 capsule    Refill:  1   glimepiride (AMARYL) 4 MG tablet    Sig: Take 1 tablet (4 mg total) by mouth 2 (two) times daily.    Dispense:  60 tablet    Refill:  3    Increase glimepiride, reducing dose of metformin.   omeprazole (PRILOSEC) 40 MG capsule    Sig: TAKE ONE CAPSULE DAILY FOR HEARTBURN    Dispense:  30 capsule    Refill:  7   meloxicam (MOBIC) 7.5 MG tablet    Sig: Take 1 tablet (7.5 mg total) by mouth 2 (two) times daily.    Dispense:  60 tablet    Refill:  3   dapagliflozin propanediol (FARXIGA) 10 MG TABS tablet    Sig: Take 1 tablet (10 mg total) by mouth daily before breakfast.    Dispense:  90 tablet    Refill:  1    Return in 3 months (on 09/08/2021) for F/U, Recheck A1C, Kellye Mizner PCP.   Total time spent:30 Minutes Time spent includes review of chart, medications, test results, and follow up plan with the patient.   [place] Controlled Substance Database was reviewed by me.  This patient was seen by Jonetta Osgood, FNP-C in collaboration with Dr. Clayborn Bigness as a part of collaborative care agreement.   Jordany Russett R. Valetta Fuller, MSN, FNP-C Internal medicine

## 2021-06-08 NOTE — Progress Notes (Signed)
Error     Current Medication: Outpatient Encounter Medications as of 06/08/2021  Medication Sig   Accu-Chek Softclix Lancets lancets USE AS DIRECTED TO CHECK BLOOD SUGARS THREE TIMES A DAY DX CODE E11.9   Crisaborole (EUCRISA) 2 % OINT Apply 1 application topically at bedtime.   dapagliflozin propanediol (FARXIGA) 10 MG TABS tablet Take 1 tablet (10 mg total) by mouth daily before breakfast.   fenofibrate (TRICOR) 145 MG tablet Take 145 mg by mouth daily.   fexofenadine (ALLEGRA) 180 MG tablet Take 180 mg by mouth daily.   gabapentin (NEURONTIN) 300 MG capsule TAKE ONE CAPSULE BY MOUTH EVERY MORNING, 1 AT MIDDAY AND 2 AT AT BEDTIME   glimepiride (AMARYL) 4 MG tablet Take 1 tablet (4 mg total) by mouth 2 (two) times daily.   glucose blood (ACCU-CHEK AVIVA PLUS) test strip CHECK BLOOD SUGARS 3 TIMES A DAY   hydrochlorothiazide (HYDRODIURIL) 12.5 MG tablet Take 1 tablet (12.5 mg total) by mouth daily.   meloxicam (MOBIC) 7.5 MG tablet Take 1 tablet (7.5 mg total) by mouth 2 (two) times daily.   metFORMIN (GLUCOPHAGE) 1000 MG tablet Take 1 tablet po QAM and take 1/2 tablet po QPM   metoprolol succinate (TOPROL-XL) 100 MG 24 hr tablet Take 1 tablet (100 mg total) by mouth daily. Take with or immediately following a meal.   morphine (MSIR) 30 MG tablet Take 30 mg by mouth every 12 (twelve) hours.   omeprazole (PRILOSEC) 40 MG capsule TAKE ONE CAPSULE DAILY FOR HEARTBURN   OZEMPIC, 1 MG/DOSE, 4 MG/3ML SOPN INJECT 1MG  INTO THE SKIN ONCE A WEEK\   pneumococcal 23 valent vaccine (PNEUMOVAX 23) 25 MCG/0.5ML injection Inject 0.92ml IM once   pravastatin (PRAVACHOL) 40 MG tablet TAKE 1 TABLET BY MOUTH EVERYDAY AT BEDTIME   promethazine (PHENERGAN) 12.5 MG tablet Take 12.5 mg by mouth 2 (two) times daily as needed for nausea or vomiting.   ramipril (ALTACE) 10 MG capsule TAKE 1 CAPSULE BY MOUTH EVERY DAY   No facility-administered encounter medications on file as of 06/08/2021.    Surgical  History: History reviewed. No pertinent surgical history.  Medical History: Past Medical History:  Diagnosis Date   Compressed cervical disc    Diabetes mellitus without complication (HCC)    Hypertension    Renal disorder    Seizures ([place])     Family History: Family History  Problem Relation Age of Onset   Ovarian cancer Mother    Lymphoma Father     Social History   Socioeconomic History   Marital status: Single    Spouse name: Not on file   Number of children: Not on file   Years of education: Not on file   Highest education level: Not on file  Occupational History   Not on file  Tobacco Use   Smoking status: Former    Pack years: 0.00    Types: Cigarettes   Smokeless tobacco: Never   Tobacco comments:    quit 30 years ago  Vaping Use   Vaping Use: Never used  Substance and Sexual Activity   Alcohol use: No   Drug use: No   Sexual activity: Not Currently  Other Topics Concern   Not on file  Social History Narrative   Not on file   Social Determinants of Health   Financial Resource Strain: Not on file  Food Insecurity: Not on file  Transportation Needs: Not on file  Physical Activity: Not on file  Stress: Not on file  Social Connections: Not on file  Intimate Partner Violence: Not on file

## 2021-06-09 ENCOUNTER — Other Ambulatory Visit: Payer: Self-pay

## 2021-06-09 DIAGNOSIS — I10 Essential (primary) hypertension: Secondary | ICD-10-CM

## 2021-06-09 MED ORDER — HYDROCHLOROTHIAZIDE 12.5 MG PO TABS: 12.5000 mg | ORAL_TABLET | Freq: Every day | ORAL | 3 refills | Status: DC

## 2021-06-13 ENCOUNTER — Ambulatory Visit: Payer: Medicaid Other | Admitting: Dermatology

## 2021-06-13 ENCOUNTER — Other Ambulatory Visit: Payer: Self-pay

## 2021-06-13 DIAGNOSIS — E1165 Type 2 diabetes mellitus with hyperglycemia: Secondary | ICD-10-CM

## 2021-06-13 MED ORDER — OZEMPIC (1 MG/DOSE) 4 MG/3ML ~~LOC~~ SOPN: PEN_INJECTOR | SUBCUTANEOUS | 3 refills | Status: DC

## 2021-06-27 ENCOUNTER — Other Ambulatory Visit: Payer: Self-pay

## 2021-06-27 ENCOUNTER — Ambulatory Visit: Payer: Medicaid Other | Admitting: Dermatology

## 2021-06-27 ENCOUNTER — Other Ambulatory Visit: Payer: Self-pay | Admitting: Nurse Practitioner

## 2021-06-27 DIAGNOSIS — E1165 Type 2 diabetes mellitus with hyperglycemia: Secondary | ICD-10-CM

## 2021-06-27 MED ORDER — OZEMPIC (1 MG/DOSE) 4 MG/3ML ~~LOC~~ SOPN: PEN_INJECTOR | SUBCUTANEOUS | 3 refills | Status: DC

## 2021-06-27 MED ORDER — ACCU-CHEK AVIVA PLUS VI STRP: ORAL_STRIP | 6 refills | Status: DC

## 2021-06-27 MED ORDER — PRAVASTATIN SODIUM 40 MG PO TABS: ORAL_TABLET | ORAL | 6 refills | Status: DC

## 2021-06-28 ENCOUNTER — Other Ambulatory Visit: Payer: Self-pay

## 2021-06-28 DIAGNOSIS — E1165 Type 2 diabetes mellitus with hyperglycemia: Secondary | ICD-10-CM

## 2021-06-28 MED ORDER — PRAVASTATIN SODIUM 40 MG PO TABS: ORAL_TABLET | ORAL | 6 refills | Status: DC

## 2021-06-28 MED ORDER — ACCU-CHEK AVIVA PLUS VI STRP: ORAL_STRIP | 6 refills | Status: DC

## 2021-06-29 ENCOUNTER — Other Ambulatory Visit: Payer: Self-pay

## 2021-06-29 MED ORDER — ACCU-CHEK SOFTCLIX LANCETS MISC: 6 refills | Status: DC

## 2021-07-13 ENCOUNTER — Ambulatory Visit (INDEPENDENT_AMBULATORY_CARE_PROVIDER_SITE_OTHER): Payer: Medicaid Other | Admitting: Dermatology

## 2021-07-13 ENCOUNTER — Other Ambulatory Visit: Payer: Self-pay

## 2021-07-13 DIAGNOSIS — D1800 Hemangioma unspecified site: Secondary | ICD-10-CM

## 2021-07-13 DIAGNOSIS — L858 Other specified epidermal thickening: Secondary | ICD-10-CM

## 2021-07-13 DIAGNOSIS — L821 Other seborrheic keratosis: Secondary | ICD-10-CM | POA: Diagnosis not present

## 2021-07-13 DIAGNOSIS — L2089 Other atopic dermatitis: Secondary | ICD-10-CM | POA: Diagnosis not present

## 2021-07-13 MED ORDER — OPZELURA 1.5 % EX CREA: 1.0000 "application " | TOPICAL_CREAM | Freq: Two times a day (BID) | CUTANEOUS | 5 refills | Status: DC

## 2021-07-13 MED ORDER — EUCRISA 2 % EX OINT: 1.0000 "application " | TOPICAL_OINTMENT | Freq: Every day | CUTANEOUS | 5 refills | Status: DC

## 2021-07-13 NOTE — Patient Instructions (Signed)

## 2021-07-13 NOTE — Progress Notes (Signed)
   Follow-Up Visit   Subjective  Joseph Stark is a 60 y.o. male who presents for the following: Dermatitis (9 months f/u dermatitis on the legs, treating with Eucrisa ointment with a fair response, no longer itching ).  The following portions of the chart were reviewed this encounter and updated as appropriate:   Tobacco  Allergies  Meds  Problems  Med Hx  Surg Hx  Fam Hx     Review of Systems:  No other skin or systemic complaints except as noted in HPI or Assessment and Plan.  Objective  Well appearing patient in no apparent distress; mood and affect are within normal limits.  A focused examination was performed including legs,arms. Relevant physical exam findings are noted in the Assessment and Plan.  bilateral legs Scale and peeling, crust and excoriations           Assessment & Plan  Atopic dermatitis with hyperkeratosis and excoriations  overall much improved bilateral legs  Improved from 2020, stable from last visit 9 months ago  Atopic dermatitis (eczema) is a chronic, relapsing, pruritic condition that can significantly affect quality of life. It is often associated with allergic rhinitis and/or asthma and can require treatment with topical medications, phototherapy, or in severe cases a biologic medication called Dupixent in children and adults.    Pt declines Dupixent injections Start Opzelura cream apply to legs twice a day  If Opzelura is not covered by insurance continue Eucrisa  Related Medications Crisaborole (EUCRISA) 2 % OINT Apply 1 application topically at bedtime.  Seborrheic Keratoses - Stuck-on, waxy, tan-brown papules and/or plaques  - Benign-appearing - Discussed benign etiology and prognosis. - Observe - Call for any changes  Hemangiomas - Red papules - Discussed benign nature - Observe - Call for any changes   Return in about 2 months (around 09/13/2021) for dermatitis .  IMarye Round, CMA, am acting as scribe  for Sarina Ser, MD .  Documentation: I have reviewed the above documentation for accuracy and completeness, and I agree with the above.  Sarina Ser, MD

## 2021-07-18 ENCOUNTER — Encounter: Payer: Self-pay | Admitting: Dermatology

## 2021-07-25 ENCOUNTER — Other Ambulatory Visit: Payer: Self-pay | Admitting: Nurse Practitioner

## 2021-07-26 ENCOUNTER — Other Ambulatory Visit: Payer: Self-pay | Admitting: Nurse Practitioner

## 2021-07-26 ENCOUNTER — Other Ambulatory Visit: Payer: Self-pay | Admitting: Internal Medicine

## 2021-08-23 ENCOUNTER — Other Ambulatory Visit: Payer: Self-pay | Admitting: Internal Medicine

## 2021-08-23 DIAGNOSIS — E1165 Type 2 diabetes mellitus with hyperglycemia: Secondary | ICD-10-CM

## 2021-08-23 DIAGNOSIS — K219 Gastro-esophageal reflux disease without esophagitis: Secondary | ICD-10-CM

## 2021-08-23 DIAGNOSIS — M542 Cervicalgia: Secondary | ICD-10-CM

## 2021-08-23 DIAGNOSIS — I10 Essential (primary) hypertension: Secondary | ICD-10-CM

## 2021-08-23 DIAGNOSIS — G8929 Other chronic pain: Secondary | ICD-10-CM

## 2021-08-23 DIAGNOSIS — M150 Primary generalized (osteo)arthritis: Secondary | ICD-10-CM

## 2021-08-29 ENCOUNTER — Telehealth: Payer: Self-pay

## 2021-08-29 NOTE — Telephone Encounter (Signed)
Opzelura will not be covered by Medicaid. Patient has to try and fail two formulary options. He has tried [place], now it has to be either Pimecrolimus or Tacrolimus.

## 2021-08-30 MED ORDER — PROTOPIC 0.1 % EX OINT: TOPICAL_OINTMENT | Freq: Two times a day (BID) | CUTANEOUS | 1 refills | Status: DC

## 2021-08-30 NOTE — Telephone Encounter (Signed)
Protopic Sent in. Tried calling patient but no answer and no voicemail.

## 2021-08-31 NOTE — Telephone Encounter (Signed)
Patient advised of medication change.

## 2021-09-06 ENCOUNTER — Other Ambulatory Visit: Payer: Self-pay | Admitting: Internal Medicine

## 2021-09-06 DIAGNOSIS — E1165 Type 2 diabetes mellitus with hyperglycemia: Secondary | ICD-10-CM

## 2021-09-06 DIAGNOSIS — M150 Primary generalized (osteo)arthritis: Secondary | ICD-10-CM

## 2021-09-06 DIAGNOSIS — G8929 Other chronic pain: Secondary | ICD-10-CM

## 2021-09-06 DIAGNOSIS — I10 Essential (primary) hypertension: Secondary | ICD-10-CM

## 2021-09-06 DIAGNOSIS — M542 Cervicalgia: Secondary | ICD-10-CM

## 2021-09-06 DIAGNOSIS — K219 Gastro-esophageal reflux disease without esophagitis: Secondary | ICD-10-CM

## 2021-09-08 ENCOUNTER — Ambulatory Visit: Payer: Self-pay | Admitting: Nurse Practitioner

## 2021-09-08 ENCOUNTER — Encounter: Payer: Self-pay | Admitting: Nurse Practitioner

## 2021-09-08 ENCOUNTER — Other Ambulatory Visit: Payer: Self-pay

## 2021-09-08 ENCOUNTER — Encounter (INDEPENDENT_AMBULATORY_CARE_PROVIDER_SITE_OTHER): Payer: Self-pay

## 2021-09-08 VITALS — BP 122/74 | HR 70 | Temp 98.4°F | Resp 16 | Ht 71.0 in | Wt 205.2 lb

## 2021-09-08 DIAGNOSIS — I10 Essential (primary) hypertension: Secondary | ICD-10-CM

## 2021-09-08 DIAGNOSIS — Z23 Encounter for immunization: Secondary | ICD-10-CM

## 2021-09-08 DIAGNOSIS — E1165 Type 2 diabetes mellitus with hyperglycemia: Secondary | ICD-10-CM | POA: Diagnosis not present

## 2021-09-08 LAB — POCT GLYCOSYLATED HEMOGLOBIN (HGB A1C): Hemoglobin A1C: 6.6 % — AB (ref 4.0–5.6)

## 2021-09-08 NOTE — Progress Notes (Signed)
[facility] [place], [place] [address]  Internal MEDICINE  Office Visit Note  Patient Name: Joseph Stark  [id number]  [id number]  Date of Service: 09/08/2021  Chief Complaint  Patient presents with   Follow-up   Diabetes   Hypertension    HPI Joseph Stark presents for a follow up visit for diabetes and hypertension. His A1C was 6.6 today which is improved from 7.0 in June 2022. He takes glimepiride, farxiga, ozempic and metformin to control his diabetes. No adjustments were made to his medications at his previous office visit because his A1C was continuing to improve. He is now at goal for A1C which is less than 7.0.  His current blood pressure is well controlled today. He is still taking hydrochlorothiazide, metoprolol succinate, and ramipril.  For his neuropathy, he takes gabapentin 300 mg in the AM, 300 mg at midday and 600 mg at bedtime every day.     Current Medication: Outpatient Encounter Medications as of 09/08/2021  Medication Sig   Accu-Chek Softclix Lancets lancets USE AS DIRECTED TO CHECK BLOOD SUGARS THREE TIMES A DAY DX CODE E11.9   Crisaborole (EUCRISA) 2 % OINT Apply 1 application topically at bedtime.   dapagliflozin propanediol (FARXIGA) 10 MG TABS tablet Take 1 tablet (10 mg total) by mouth daily before breakfast.   fenofibrate (TRICOR) 145 MG tablet Take 145 mg by mouth daily.   fexofenadine (ALLEGRA) 180 MG tablet Take 180 mg by mouth daily.   gabapentin (NEURONTIN) 300 MG capsule TAKE ONE CAPSULE BY MOUTH EVERY MORNING, 1 AT MIDDAY AND 2 AT AT BEDTIME   glimepiride (AMARYL) 4 MG tablet TAKE 1 TABLET BY MOUTH 2 TIMES DAILY   glucose blood (ACCU-CHEK AVIVA PLUS) test strip CHECK BLOOD SUGARS 3 TIMES A DAY   hydrochlorothiazide (HYDRODIURIL) 12.5 MG tablet Take 1 tablet (12.5 mg total) by mouth daily.   meloxicam (MOBIC) 7.5 MG tablet Take 1 tablet (7.5 mg total) by mouth 2 (two) times daily.   metFORMIN (GLUCOPHAGE) 1000 MG tablet  Take 1 tablet po QAM and take 1/2 tablet po QPM   metoprolol succinate (TOPROL-XL) 100 MG 24 hr tablet TAKE 1 TABLET BY MOUTH DAILY. TAKE WITH OR IMMEDIATELY FOLLOWING A MEAL.   morphine (MSIR) 30 MG tablet Take 30 mg by mouth every 12 (twelve) hours.   omeprazole (PRILOSEC) 40 MG capsule TAKE ONE CAPSULE DAILY FOR HEARTBURN   OZEMPIC, 1 MG/DOSE, 4 MG/3ML SOPN INJECT 1MG  INTO THE SKIN ONCE A WEEK\   pneumococcal 23 valent vaccine (PNEUMOVAX 23) 25 MCG/0.5ML injection Inject 0.32ml IM once   pravastatin (PRAVACHOL) 40 MG tablet TAKE 1 TABLET BY MOUTH EVERYDAY AT BEDTIME   promethazine (PHENERGAN) 12.5 MG tablet Take 12.5 mg by mouth 2 (two) times daily as needed for nausea or vomiting.   PROTOPIC 0.1 % ointment Apply topically 2 (two) times daily.   ramipril (ALTACE) 10 MG capsule TAKE 1 CAPSULE BY MOUTH EVERY DAY   Ruxolitinib Phosphate (OPZELURA) 1.5 % CREA Apply 1 application topically 2 (two) times daily. Apply to skin twice a day   No facility-administered encounter medications on file as of 09/08/2021.    Surgical History: History reviewed. No pertinent surgical history.  Medical History: Past Medical History:  Diagnosis Date   Compressed cervical disc    Diabetes mellitus without complication ([place])    Hypertension    Renal disorder    Seizures ([place])     Family History: Family History  Problem Relation Age of Onset  Ovarian cancer Mother    Lymphoma Father     Social History   Socioeconomic History   Marital status: Single    Spouse name: Not on file   Number of children: Not on file   Years of education: Not on file   Highest education level: Not on file  Occupational History   Not on file  Tobacco Use   Smoking status: Former    Types: Cigarettes   Smokeless tobacco: Never   Tobacco comments:    quit 30 years ago  Vaping Use   Vaping Use: Never used  Substance and Sexual Activity   Alcohol use: No   Drug use: No   Sexual activity: Not Currently  Other  Topics Concern   Not on file  Social History Narrative   Not on file   Social Determinants of Health   Financial Resource Strain: Not on file  Food Insecurity: Not on file  Transportation Needs: Not on file  Physical Activity: Not on file  Stress: Not on file  Social Connections: Not on file  Intimate Partner Violence: Not on file      Review of Systems  Constitutional:  Negative for chills, fatigue and unexpected weight change.  HENT:  Negative for congestion, rhinorrhea, sneezing and sore throat.   Eyes:  Negative for redness.  Respiratory:  Negative for cough, chest tightness and shortness of breath.   Cardiovascular:  Negative for chest pain and palpitations.  Gastrointestinal:  Negative for abdominal pain, constipation, diarrhea, nausea and vomiting.  Genitourinary:  Negative for dysuria and frequency.  Musculoskeletal:  Negative for arthralgias, back pain, joint swelling and neck pain.  Skin:  Negative for rash.  Neurological: Negative.  Negative for tremors and numbness.  Hematological:  Negative for adenopathy. Does not bruise/bleed easily.  Psychiatric/Behavioral:  Negative for behavioral problems (Depression), sleep disturbance and suicidal ideas. The patient is not nervous/anxious.    Vital Signs: BP 122/74   Pulse 70   Temp 98.4 F (36.9 C)   Resp 16   Ht 5\' 11"  (1.803 m)   Wt 205 lb 3.2 oz (93.1 kg)   SpO2 98%   BMI 28.62 kg/m    Physical Exam Vitals reviewed.  Constitutional:      Appearance: Normal appearance. He is obese.  HENT:     Head: Normocephalic and atraumatic.  Eyes:     Extraocular Movements: Extraocular movements intact.     Pupils: Pupils are equal, round, and reactive to light.  Cardiovascular:     Rate and Rhythm: Normal rate and regular rhythm.  Pulmonary:     Effort: Pulmonary effort is normal. No respiratory distress.  Neurological:     Mental Status: He is alert and oriented to person, place, and time.     Cranial Nerves: No  cranial nerve deficit.     Coordination: Coordination normal.     Gait: Gait normal.  Psychiatric:        Mood and Affect: Mood normal.        Behavior: Behavior normal.       Assessment/Plan: 1. Type 2 diabetes mellitus with hyperglycemia, without long-term current use of insulin (HCC) A1C continues to improve, continue current medications as prescribed. Follow up in 3 months.  - POCT HgB A1C  2. Essential hypertension Stable with current medications, continue as prescribed.  3. Needs flu shot Administered in office today - Flu Vaccine MDCK QUAD PF   General Counseling: Zaedyn verbalizes understanding of the findings of todays  visit and agrees with plan of treatment. I have discussed any further diagnostic evaluation that may be needed or ordered today. We also reviewed his medications today. he has been encouraged to call the office with any questions or concerns that should arise related to todays visit.    Orders Placed This Encounter  Procedures   Flu Vaccine MDCK QUAD PF   POCT HgB A1C    No orders of the defined types were placed in this encounter.   Return in about 3 months (around 12/08/2021) for F/U, Recheck A1C, Tareq Dwan PCP.   Total time spent:20 Minutes Time spent includes review of chart, medications, test results, and follow up plan with the patient.   [place] Controlled Substance Database was reviewed by me.  This patient was seen by Jonetta Osgood, FNP-C in collaboration with Dr. Clayborn Bigness as a part of collaborative care agreement.   Emunah Texidor R. Valetta Fuller, MSN, FNP-C Internal medicine

## 2021-09-11 ENCOUNTER — Other Ambulatory Visit: Payer: Self-pay | Admitting: Internal Medicine

## 2021-09-11 ENCOUNTER — Other Ambulatory Visit: Payer: Self-pay | Admitting: Nurse Practitioner

## 2021-09-11 DIAGNOSIS — I10 Essential (primary) hypertension: Secondary | ICD-10-CM

## 2021-09-11 DIAGNOSIS — E1165 Type 2 diabetes mellitus with hyperglycemia: Secondary | ICD-10-CM

## 2021-09-13 ENCOUNTER — Emergency Department
Admission: EM | Admit: 2021-09-13 | Discharge: 2021-09-14 | Disposition: A | Discharge status: Home / Self Care | Payer: Medicaid Other | Attending: Emergency Medicine | Admitting: Emergency Medicine

## 2021-09-13 ENCOUNTER — Other Ambulatory Visit: Payer: Self-pay

## 2021-09-13 ENCOUNTER — Emergency Department: Payer: Medicaid Other

## 2021-09-13 ENCOUNTER — Ambulatory Visit: Payer: Medicaid Other | Admitting: Dermatology

## 2021-09-13 ENCOUNTER — Encounter: Payer: Self-pay | Admitting: Emergency Medicine

## 2021-09-13 DIAGNOSIS — Z7984 Long term (current) use of oral hypoglycemic drugs: Secondary | ICD-10-CM | POA: Diagnosis not present

## 2021-09-13 DIAGNOSIS — Z87891 Personal history of nicotine dependence: Secondary | ICD-10-CM | POA: Insufficient documentation

## 2021-09-13 DIAGNOSIS — Y92000 Kitchen of unspecified non-institutional (private) residence as  the place of occurrence of the external cause: Secondary | ICD-10-CM | POA: Diagnosis not present

## 2021-09-13 DIAGNOSIS — S0990XA Unspecified injury of head, initial encounter: Secondary | ICD-10-CM

## 2021-09-13 DIAGNOSIS — L2089 Other atopic dermatitis: Secondary | ICD-10-CM

## 2021-09-13 DIAGNOSIS — E119 Type 2 diabetes mellitus without complications: Secondary | ICD-10-CM | POA: Insufficient documentation

## 2021-09-13 DIAGNOSIS — I10 Essential (primary) hypertension: Secondary | ICD-10-CM | POA: Insufficient documentation

## 2021-09-13 DIAGNOSIS — Z23 Encounter for immunization: Secondary | ICD-10-CM | POA: Diagnosis not present

## 2021-09-13 DIAGNOSIS — W01198A Fall on same level from slipping, tripping and stumbling with subsequent striking against other object, initial encounter: Secondary | ICD-10-CM | POA: Insufficient documentation

## 2021-09-13 DIAGNOSIS — S80211A Abrasion, right knee, initial encounter: Secondary | ICD-10-CM | POA: Insufficient documentation

## 2021-09-13 DIAGNOSIS — Z79899 Other long term (current) drug therapy: Secondary | ICD-10-CM | POA: Insufficient documentation

## 2021-09-13 DIAGNOSIS — S80212A Abrasion, left knee, initial encounter: Secondary | ICD-10-CM | POA: Diagnosis not present

## 2021-09-13 MED ORDER — MUPIROCIN 2 % EX OINT: 1.0000 "application " | TOPICAL_OINTMENT | Freq: Two times a day (BID) | CUTANEOUS | 3 refills | Status: DC

## 2021-09-13 NOTE — Patient Instructions (Signed)

## 2021-09-13 NOTE — ED Triage Notes (Signed)
Pt arrived via POV with reports of tripping in his kitchen and fell hitting head on kitchen cabinet, no bleeding noted at this time. Pt denies any LOC.   Pt also has abrasions to bilateral knees. Pt reports hitting his knees as well.

## 2021-09-14 MED ORDER — TETANUS-DIPHTH-ACELL PERTUSSIS 5-2.5-18.5 LF-MCG/0.5 IM SUSY
0.5000 mL | PREFILLED_SYRINGE | Freq: Once | INTRAMUSCULAR | Status: AC
  Administered 2021-09-14: 0.5 mL via INTRAMUSCULAR
  Filled 2021-09-14: qty 0.5

## 2021-09-14 NOTE — ED Notes (Signed)
Pt refused discharge vitals at this time. Pt was ambulatory at discharge alert and oriented

## 2021-09-14 NOTE — Discharge Instructions (Addendum)
You may take over-the-counter Tylenol 1000 mg every 6 hours as needed for pain. 
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
## 2021-09-15 ENCOUNTER — Encounter: Payer: Self-pay | Admitting: Dermatology

## 2021-09-15 NOTE — Progress Notes (Signed)
   Follow-Up Visit   Subjective  Joseph Stark is a 60 y.o. male who presents for the following: Follow-up (Atopic derm follow up - Eucrisa oint qhs. Opzelura not covered, Protopic sent in but he has not gotten it yet. He does not want Dupixent injections).  The following portions of the chart were reviewed this encounter and updated as appropriate:   Tobacco  Allergies  Meds  Problems  Med Hx  Surg Hx  Fam Hx     Review of Systems:  No other skin or systemic complaints except as noted in HPI or Assessment and Plan.  Objective  Well appearing patient in no apparent distress; mood and affect are within normal limits.  A focused examination was performed including legs. Relevant physical exam findings are noted in the Assessment and Plan.   Assessment & Plan  Other atopic dermatitis Legs Improved from baseline, but not to goal.  Pt not using treatment regularly.  Atopic dermatitis (eczema) is a chronic, relapsing, pruritic condition that can significantly affect quality of life. It is often associated with allergic rhinitis and/or asthma and can require treatment with topical medications, phototherapy, or in severe cases a biologic medication called Dupixent in children and adults.   Very similar exam to last appointment possibly slightly improved.  Start Protopic 0.1% oint qam.  Continue Eucrisa oint qhs.  Start Mupirocin oint bid to any scabs or sores. Encouraged pt to use medications more regularly for improved results.  Discussed Dupixent vs Adbry vs Rinvoq vs Cinbiqo - Patient declines today.  mupirocin ointment (BACTROBAN) 2 % - Legs Apply 1 application topically 2 (two) times daily. To any open sores or scabs  Related Medications Crisaborole (EUCRISA) 2 % OINT Apply 1 application topically at bedtime.  Return in about 5 months (around 02/13/2022) for Follow up.  I, Ashok Cordia, CMA, am acting as scribe for Sarina Ser, MD . Documentation: I have  reviewed the above documentation for accuracy and completeness, and I agree with the above.  Sarina Ser, MD

## 2021-09-21 ENCOUNTER — Encounter: Payer: Self-pay | Admitting: Nurse Practitioner

## 2021-09-21 ENCOUNTER — Other Ambulatory Visit: Payer: Self-pay

## 2021-09-21 ENCOUNTER — Ambulatory Visit: Payer: Self-pay | Admitting: Nurse Practitioner

## 2021-09-21 ENCOUNTER — Other Ambulatory Visit: Payer: Self-pay | Admitting: Internal Medicine

## 2021-09-21 VITALS — BP 125/76 | HR 71 | Temp 97.8°F | Resp 16 | Ht 69.0 in | Wt 206.0 lb

## 2021-09-21 DIAGNOSIS — I10 Essential (primary) hypertension: Secondary | ICD-10-CM

## 2021-09-21 DIAGNOSIS — S0001XA Abrasion of scalp, initial encounter: Secondary | ICD-10-CM | POA: Diagnosis not present

## 2021-09-21 DIAGNOSIS — S80211D Abrasion, right knee, subsequent encounter: Secondary | ICD-10-CM

## 2021-09-21 DIAGNOSIS — K219 Gastro-esophageal reflux disease without esophagitis: Secondary | ICD-10-CM

## 2021-09-21 DIAGNOSIS — M542 Cervicalgia: Secondary | ICD-10-CM

## 2021-09-21 DIAGNOSIS — G8929 Other chronic pain: Secondary | ICD-10-CM

## 2021-09-21 DIAGNOSIS — E1165 Type 2 diabetes mellitus with hyperglycemia: Secondary | ICD-10-CM

## 2021-09-21 DIAGNOSIS — M150 Primary generalized (osteo)arthritis: Secondary | ICD-10-CM

## 2021-09-21 NOTE — Progress Notes (Addendum)
[facility] [place], [place] [address]  Internal MEDICINE  Office Visit Note  Patient Name: Joseph Stark  [id number]  [id number]  Date of Service: 10/10/2021  Chief Complaint  Patient presents with   Follow-up    ED for Fall    Hypertension   Diabetes    HPI Joseph Stark present for a follow up visit for a head injury as the result of a fall he experienced on 09/13/21 and he went to the ED. There is an abrasion to the right parietal area of the scalp, bruising to the left knee and a scabbed abrasion to the right knee. He reports that he had felt dizzy when this fall happened but did not lose consciousness. The bruising to the left knee is resolving. The xrays of both knees were negative for skeltal abnormality, fracture and soft tissue swelling. The CT head was negative for any acute intracranial abnormalities. He reports that he is feeling much better.    Current Medication: Outpatient Encounter Medications as of 09/21/2021  Medication Sig   Accu-Chek Softclix Lancets lancets USE AS DIRECTED TO CHECK BLOOD SUGARS THREE TIMES A DAY DX CODE E11.9   Crisaborole (EUCRISA) 2 % OINT Apply 1 application topically at bedtime.   dapagliflozin propanediol (FARXIGA) 10 MG TABS tablet Take 1 tablet (10 mg total) by mouth daily before breakfast.   fenofibrate (TRICOR) 145 MG tablet Take 145 mg by mouth daily.   fexofenadine (ALLEGRA) 180 MG tablet Take 180 mg by mouth daily.   gabapentin (NEURONTIN) 300 MG capsule TAKE ONE CAPSULE BY MOUTH EVERY MORNING, 1 AT MIDDAY AND 2 AT AT BEDTIME   glimepiride (AMARYL) 4 MG tablet TAKE 1 TABLET BY MOUTH 2 TIMES DAILY   glucose blood (ACCU-CHEK AVIVA PLUS) test strip CHECK BLOOD SUGARS 3 TIMES A DAY   hydrochlorothiazide (HYDRODIURIL) 12.5 MG tablet TAKE 1 TABLET BY MOUTH EVERY DAY   metFORMIN (GLUCOPHAGE) 1000 MG tablet TAKE 1 TABLET BY MOUTH IN AM AND TAKE 1/2 TABLET IN PM   metoprolol succinate (TOPROL-XL) 100 MG 24 hr  tablet TAKE 1 TABLET BY MOUTH DAILY. TAKE WITH OR IMMEDIATELY FOLLOWING A MEAL.   morphine (MSIR) 30 MG tablet Take 30 mg by mouth every 12 (twelve) hours.   mupirocin ointment (BACTROBAN) 2 % Apply 1 application topically 2 (two) times daily. To any open sores or scabs   omeprazole (PRILOSEC) 40 MG capsule TAKE ONE CAPSULE DAILY FOR HEARTBURN   OZEMPIC, 1 MG/DOSE, 4 MG/3ML SOPN INJECT 1MG  INTO THE SKIN ONCE A WEEK\   pneumococcal 23 valent vaccine (PNEUMOVAX 23) 25 MCG/0.5ML injection Inject 0.38ml IM once   pravastatin (PRAVACHOL) 40 MG tablet TAKE 1 TABLET BY MOUTH EVERYDAY AT BEDTIME   promethazine (PHENERGAN) 12.5 MG tablet Take 12.5 mg by mouth 2 (two) times daily as needed for nausea or vomiting.   PROTOPIC 0.1 % ointment Apply topically 2 (two) times daily.   Ruxolitinib Phosphate (OPZELURA) 1.5 % CREA Apply 1 application topically 2 (two) times daily. Apply to skin twice a day   [DISCONTINUED] meloxicam (MOBIC) 7.5 MG tablet Take 1 tablet (7.5 mg total) by mouth 2 (two) times daily.   [DISCONTINUED] ramipril (ALTACE) 10 MG capsule TAKE 1 CAPSULE BY MOUTH EVERY DAY   No facility-administered encounter medications on file as of 09/21/2021.    Surgical History: History reviewed. No pertinent surgical history.  Medical History: Past Medical History:  Diagnosis Date   Compressed cervical disc    Diabetes mellitus  without complication (HCC)    Hypertension    Renal disorder    Seizures ([place])     Family History: Family History  Problem Relation Age of Onset   Ovarian cancer Mother    Lymphoma Father     Social History   Socioeconomic History   Marital status: Single    Spouse name: Not on file   Number of children: Not on file   Years of education: Not on file   Highest education level: Not on file  Occupational History   Not on file  Tobacco Use   Smoking status: Former    Types: Cigarettes   Smokeless tobacco: Never   Tobacco comments:    quit 30 years ago   Vaping Use   Vaping Use: Never used  Substance and Sexual Activity   Alcohol use: No   Drug use: No   Sexual activity: Not Currently  Other Topics Concern   Not on file  Social History Narrative   Not on file   Social Determinants of Health   Financial Resource Strain: Not on file  Food Insecurity: Not on file  Transportation Needs: Not on file  Physical Activity: Not on file  Stress: Not on file  Social Connections: Not on file  Intimate Partner Violence: Not on file      Review of Systems  Constitutional:  Negative for chills, fatigue and unexpected weight change.  HENT:  Negative for congestion, rhinorrhea, sneezing and sore throat.   Eyes:  Negative for redness.  Respiratory:  Negative for cough, chest tightness and shortness of breath.   Cardiovascular:  Negative for chest pain and palpitations.  Gastrointestinal:  Negative for abdominal pain, constipation, diarrhea, nausea and vomiting.  Genitourinary:  Negative for dysuria and frequency.  Musculoskeletal:  Negative for arthralgias, back pain, joint swelling and neck pain.  Skin:  Positive for wound (abrasion to scalp and right knee, bruising to left knee). Negative for rash.  Neurological:  Positive for dizziness. Negative for tremors, numbness and headaches.  Hematological:  Negative for adenopathy. Does not bruise/bleed easily.  Psychiatric/Behavioral:  Negative for behavioral problems (Depression), sleep disturbance and suicidal ideas. The patient is not nervous/anxious.    Vital Signs: BP 125/76   Pulse 71   Temp 97.8 F (36.6 C)   Resp 16   Ht 5\' 9"  (1.753 m)   Wt 206 lb (93.4 kg)   SpO2 98%   BMI 30.42 kg/m    Physical Exam Constitutional:      General: He is not in acute distress.    Appearance: Normal appearance. He is obese. He is not ill-appearing.  HENT:     Head: Normocephalic and atraumatic.  Eyes:     Extraocular Movements: Extraocular movements intact.     Pupils: Pupils are equal,  round, and reactive to light.  Cardiovascular:     Rate and Rhythm: Normal rate and regular rhythm.  Pulmonary:     Effort: Pulmonary effort is normal. No respiratory distress.  Neurological:     Mental Status: He is alert and oriented to person, place, and time.     Cranial Nerves: No cranial nerve deficit.     Coordination: Coordination normal.     Gait: Gait normal.  Psychiatric:        Mood and Affect: Mood normal.        Behavior: Behavior normal.       Assessment/Plan: .Essential hypertension Will monitor   . Abrasion, scalp w/o infection Scabbed over,  no open laceration noted, no signs of infection. Healing well. Call clinic if the area starts developing an infection.   Marland Kitchen Abrasion of right knee, subsequent encounter Scabbed over, superficial, healing.    General Counseling: zi newbury understanding of the findings of todays visit and agrees with plan of treatment. I have discussed any further diagnostic evaluation that may be needed or ordered today. We also reviewed his medications today. he has been encouraged to call the office with any questions or concerns that should arise related to todays visit.    No orders of the defined types were placed in this encounter.   No orders of the defined types were placed in this encounter.   Return if symptoms worsen or fail to improve.   Total time spent:30 Minutes Time spent includes review of chart, medications, test results, and follow up plan with the patient.   [place] Controlled Substance Database was reviewed by me.  This patient was seen by Jonetta Osgood, FNP-C in collaboration with Dr. Clayborn Bigness as a part of collaborative care agreement.   Virna Livengood R. Valetta Fuller, MSN, FNP-C Internal medicine

## 2021-09-26 ENCOUNTER — Other Ambulatory Visit: Payer: Self-pay | Admitting: Internal Medicine

## 2021-10-24 ENCOUNTER — Other Ambulatory Visit: Payer: Self-pay | Admitting: Nurse Practitioner

## 2021-10-24 DIAGNOSIS — K219 Gastro-esophageal reflux disease without esophagitis: Secondary | ICD-10-CM

## 2021-10-24 DIAGNOSIS — M150 Primary generalized (osteo)arthritis: Secondary | ICD-10-CM

## 2021-10-24 DIAGNOSIS — G8929 Other chronic pain: Secondary | ICD-10-CM

## 2021-10-24 DIAGNOSIS — I10 Essential (primary) hypertension: Secondary | ICD-10-CM

## 2021-10-24 DIAGNOSIS — E1165 Type 2 diabetes mellitus with hyperglycemia: Secondary | ICD-10-CM

## 2021-11-24 ENCOUNTER — Other Ambulatory Visit: Payer: Self-pay | Admitting: Internal Medicine

## 2021-11-24 DIAGNOSIS — E1165 Type 2 diabetes mellitus with hyperglycemia: Secondary | ICD-10-CM

## 2021-11-24 DIAGNOSIS — M150 Primary generalized (osteo)arthritis: Secondary | ICD-10-CM

## 2021-11-24 DIAGNOSIS — K219 Gastro-esophageal reflux disease without esophagitis: Secondary | ICD-10-CM

## 2021-11-24 DIAGNOSIS — M542 Cervicalgia: Secondary | ICD-10-CM

## 2021-11-24 DIAGNOSIS — G8929 Other chronic pain: Secondary | ICD-10-CM

## 2021-11-24 DIAGNOSIS — I10 Essential (primary) hypertension: Secondary | ICD-10-CM

## 2021-12-14 ENCOUNTER — Encounter: Payer: Medicaid Other | Admitting: Nurse Practitioner

## 2021-12-25 ENCOUNTER — Other Ambulatory Visit: Payer: Self-pay | Admitting: Nurse Practitioner

## 2021-12-25 DIAGNOSIS — I10 Essential (primary) hypertension: Secondary | ICD-10-CM

## 2021-12-25 DIAGNOSIS — K219 Gastro-esophageal reflux disease without esophagitis: Secondary | ICD-10-CM

## 2021-12-25 DIAGNOSIS — E1165 Type 2 diabetes mellitus with hyperglycemia: Secondary | ICD-10-CM

## 2021-12-25 DIAGNOSIS — M150 Primary generalized (osteo)arthritis: Secondary | ICD-10-CM

## 2021-12-25 DIAGNOSIS — G8929 Other chronic pain: Secondary | ICD-10-CM

## 2022-01-18 ENCOUNTER — Ambulatory Visit: Payer: Self-pay | Admitting: Nurse Practitioner

## 2022-01-18 ENCOUNTER — Encounter: Payer: Self-pay | Admitting: Nurse Practitioner

## 2022-01-18 ENCOUNTER — Other Ambulatory Visit: Payer: Self-pay

## 2022-01-18 VITALS — BP 132/74 | HR 79 | Temp 99.0°F | Resp 16 | Ht 71.0 in | Wt 208.0 lb

## 2022-01-18 DIAGNOSIS — E042 Nontoxic multinodular goiter: Secondary | ICD-10-CM

## 2022-01-18 DIAGNOSIS — E1165 Type 2 diabetes mellitus with hyperglycemia: Secondary | ICD-10-CM

## 2022-01-18 DIAGNOSIS — K219 Gastro-esophageal reflux disease without esophagitis: Secondary | ICD-10-CM | POA: Diagnosis not present

## 2022-01-18 DIAGNOSIS — M25512 Pain in left shoulder: Secondary | ICD-10-CM

## 2022-01-18 DIAGNOSIS — I10 Essential (primary) hypertension: Secondary | ICD-10-CM

## 2022-01-18 DIAGNOSIS — E559 Vitamin D deficiency, unspecified: Secondary | ICD-10-CM

## 2022-01-18 DIAGNOSIS — M150 Primary generalized (osteo)arthritis: Secondary | ICD-10-CM

## 2022-01-18 DIAGNOSIS — Z0001 Encounter for general adult medical examination with abnormal findings: Secondary | ICD-10-CM

## 2022-01-18 DIAGNOSIS — R300 Dysuria: Secondary | ICD-10-CM

## 2022-01-18 DIAGNOSIS — Z125 Encounter for screening for malignant neoplasm of prostate: Secondary | ICD-10-CM

## 2022-01-18 DIAGNOSIS — Z23 Encounter for immunization: Secondary | ICD-10-CM

## 2022-01-18 DIAGNOSIS — M542 Cervicalgia: Secondary | ICD-10-CM

## 2022-01-18 DIAGNOSIS — G8929 Other chronic pain: Secondary | ICD-10-CM

## 2022-01-18 DIAGNOSIS — E782 Mixed hyperlipidemia: Secondary | ICD-10-CM

## 2022-01-18 LAB — POCT GLYCOSYLATED HEMOGLOBIN (HGB A1C): Hemoglobin A1C: 6.8 % — AB (ref 4.0–5.6)

## 2022-01-18 MED ORDER — HYDROCHLOROTHIAZIDE 12.5 MG PO TABS: 12.5000 mg | ORAL_TABLET | Freq: Every day | ORAL | 3 refills | Status: DC

## 2022-01-18 MED ORDER — RAMIPRIL 10 MG PO CAPS: 10.0000 mg | ORAL_CAPSULE | Freq: Every day | ORAL | 3 refills | Status: DC

## 2022-01-18 MED ORDER — OMEPRAZOLE 40 MG PO CPDR: DELAYED_RELEASE_CAPSULE | ORAL | 3 refills | Status: DC

## 2022-01-18 MED ORDER — ZOSTER VAC RECOMB ADJUVANTED 50 MCG/0.5ML IM SUSR: 0.5000 mL | Freq: Once | INTRAMUSCULAR | 0 refills | Status: AC

## 2022-01-18 MED ORDER — MELOXICAM 7.5 MG PO TABS: 7.5000 mg | ORAL_TABLET | Freq: Two times a day (BID) | ORAL | 5 refills | Status: DC

## 2022-01-18 MED ORDER — PRAVASTATIN SODIUM 40 MG PO TABS: ORAL_TABLET | ORAL | 3 refills | Status: DC

## 2022-01-18 NOTE — Progress Notes (Signed)
[facility] [place], [place] [address]  Internal MEDICINE  Office Visit Note  Patient Name: Joseph Stark  [id number]  [id number]  Date of Service: 01/18/2022  Chief Complaint  Patient presents with   Annual Exam   Diabetes   Hypertension    HPI Joseph Stark presents for an annual well visit and physical exam. He is a well appearing 61 yo male with hypertension, GERD, asthma, and diabetes. His blood pressure is well controlled. He is due for prostate cancer screening. He had a negative cologard in January 2022, due again in 2025. His most recent eye exam was July 2022.  His A1C is 6.8 today which is increased by 0.2 from 6.6 in September 2022. A1C remains stable.     Current Medication: Outpatient Encounter Medications as of 01/18/2022  Medication Sig   Accu-Chek Softclix Lancets lancets USE AS DIRECTED TO CHECK BLOOD SUGARS THREE TIMES A DAY DX CODE E11.9   Crisaborole (EUCRISA) 2 % OINT Apply 1 application topically at bedtime.   FARXIGA 10 MG TABS tablet TAKE 1 TABLET BY MOUTH DAILY BEFORE BREAKFAST.   fenofibrate (TRICOR) 145 MG tablet Take 145 mg by mouth daily.   fexofenadine (ALLEGRA) 180 MG tablet Take 180 mg by mouth daily.   gabapentin (NEURONTIN) 300 MG capsule TAKE ONE CAPSULE BY MOUTH EVERY MORNING, 1 AT MIDDAY AND 2 AT AT BEDTIME   glimepiride (AMARYL) 4 MG tablet TAKE 1 TABLET BY MOUTH 2 TIMES DAILY   glucose blood (ACCU-CHEK AVIVA PLUS) test strip CHECK BLOOD SUGARS 3 TIMES A DAY   metFORMIN (GLUCOPHAGE) 1000 MG tablet TAKE 1 TABLET BY MOUTH IN AM AND TAKE 1/2 TABLET IN PM   metoprolol succinate (TOPROL-XL) 100 MG 24 hr tablet TAKE 1 TABLET BY MOUTH DAILY. TAKE WITH OR IMMEDIATELY FOLLOWING A MEAL.   morphine (MSIR) 30 MG tablet Take 30 mg by mouth every 12 (twelve) hours.   mupirocin ointment (BACTROBAN) 2 % Apply 1 application topically 2 (two) times daily. To any open sores or scabs   OZEMPIC, 1 MG/DOSE, 4 MG/3ML SOPN INJECT 1MG INTO  THE SKIN ONCE A WEEK\   pneumococcal 23 valent vaccine (PNEUMOVAX 23) 25 MCG/0.5ML injection Inject 0.65m IM once   promethazine (PHENERGAN) 12.5 MG tablet Take 12.5 mg by mouth 2 (two) times daily as needed for nausea or vomiting.   PROTOPIC 0.1 % ointment Apply topically 2 (two) times daily.   Ruxolitinib Phosphate (OPZELURA) 1.5 % CREA Apply 1 application topically 2 (two) times daily. Apply to skin twice a day   [EXPIRED] Zoster Vaccine Adjuvanted ([facility] injection Inject 0.5 mLs into the muscle once for 1 dose.   [DISCONTINUED] hydrochlorothiazide (HYDRODIURIL) 12.5 MG tablet TAKE 1 TABLET BY MOUTH EVERY DAY   [DISCONTINUED] meloxicam (MOBIC) 7.5 MG tablet TAKE 1 TABLET BY MOUTH 2 TIMES DAILY.   [DISCONTINUED] omeprazole (PRILOSEC) 40 MG capsule TAKE ONE CAPSULE DAILY FOR HEARTBURN   [DISCONTINUED] pravastatin (PRAVACHOL) 40 MG tablet TAKE 1 TABLET BY MOUTH EVERYDAY AT BEDTIME   [DISCONTINUED] ramipril (ALTACE) 10 MG capsule TAKE 1 CAPSULE BY MOUTH EVERY DAY   hydrochlorothiazide (HYDRODIURIL) 12.5 MG tablet Take 1 tablet (12.5 mg total) by mouth daily.   meloxicam (MOBIC) 7.5 MG tablet Take 1 tablet (7.5 mg total) by mouth 2 (two) times daily.   omeprazole (PRILOSEC) 40 MG capsule TAKE ONE CAPSULE DAILY FOR HEARTBURN   pravastatin (PRAVACHOL) 40 MG tablet TAKE 1 TABLET BY MOUTH EVERYDAY AT BEDTIME   ramipril (ALTACE)  10 MG capsule Take 1 capsule (10 mg total) by mouth daily.   No facility-administered encounter medications on file as of 01/18/2022.    Surgical History: History reviewed. No pertinent surgical history.  Medical History: Past Medical History:  Diagnosis Date   Compressed cervical disc    Diabetes mellitus without complication (HCC)    Hypertension    Renal disorder    Seizures ([place])     Family History: Family History  Problem Relation Age of Onset   Ovarian cancer Mother    Lymphoma Father     Social History   Socioeconomic History   Marital status:  Single    Spouse name: Not on file   Number of children: Not on file   Years of education: Not on file   Highest education level: Not on file  Occupational History   Not on file  Tobacco Use   Smoking status: Former    Types: Cigarettes   Smokeless tobacco: Never   Tobacco comments:    quit 30 years ago  Vaping Use   Vaping Use: Never used  Substance and Sexual Activity   Alcohol use: No   Drug use: No   Sexual activity: Not Currently  Other Topics Concern   Not on file  Social History Narrative   Not on file   Social Determinants of Health   Financial Resource Strain: Not on file  Food Insecurity: Not on file  Transportation Needs: Not on file  Physical Activity: Not on file  Stress: Not on file  Social Connections: Not on file  Intimate Partner Violence: Not on file      Review of Systems  Constitutional:  Negative for activity change, appetite change, chills, fatigue, fever and unexpected weight change.  HENT: Negative.  Negative for congestion, ear pain, rhinorrhea, sore throat and trouble swallowing.   Eyes: Negative.   Respiratory: Negative.  Negative for cough, chest tightness, shortness of breath and wheezing.   Cardiovascular: Negative.  Negative for chest pain and palpitations.  Gastrointestinal: Negative.  Negative for abdominal pain, blood in stool, constipation, diarrhea, nausea and vomiting.  Endocrine: Negative.   Genitourinary: Negative.  Negative for difficulty urinating, dysuria, frequency, hematuria and urgency.  Musculoskeletal:  Positive for arthralgias. Negative for back pain, joint swelling, myalgias and neck pain.  Skin: Negative.  Negative for rash and wound.  Allergic/Immunologic: Negative.  Negative for immunocompromised state.  Neurological: Negative.  Negative for dizziness, seizures, numbness and headaches.  Hematological: Negative.   Psychiatric/Behavioral: Negative.  Negative for behavioral problems, self-injury and suicidal ideas.  The patient is not nervous/anxious.    Vital Signs: BP 132/74    Pulse 79    Temp 99 F (37.2 C)    Resp 16    Ht _0  (1.803 m)    Wt 208 lb (94.3 kg)    SpO2 98%    BMI 29.01 kg/m    Physical Exam Vitals reviewed.  Constitutional:      General: He is awake. He is not in acute distress.    Appearance: Normal appearance. He is well-developed and well-groomed. He is obese. He is not ill-appearing or diaphoretic.  HENT:     Head: Normocephalic and atraumatic.     Right Ear: External ear normal. There is impacted cerumen.     Left Ear: External ear normal. There is impacted cerumen.     Nose: Nose normal. No congestion or rhinorrhea.     Mouth/Throat:     Lips: Pink.  Mouth: Mucous membranes are moist.     Pharynx: Oropharynx is clear. Uvula midline. No oropharyngeal exudate.  Eyes:     General: Lids are normal. Vision grossly intact. Gaze aligned appropriately. No scleral icterus.       Right eye: No discharge.        Left eye: No discharge.     Conjunctiva/sclera: Conjunctivae normal.     Pupils: Pupils are equal, round, and reactive to light.     Funduscopic exam:    Right eye: Red reflex present.        Left eye: Red reflex present. Neck:     Thyroid: No thyromegaly.     Vascular: No JVD.     Trachea: Trachea and phonation normal. No tracheal deviation.  Cardiovascular:     Rate and Rhythm: Normal rate. Rhythm irregular.     Pulses: Normal pulses.     Heart sounds: Normal heart sounds, S1 normal and S2 normal. No murmur heard.   No friction rub. No gallop.  Pulmonary:     Effort: Pulmonary effort is normal. No respiratory distress.     Breath sounds: Normal breath sounds. No stridor. No wheezing or rales.  Chest:     Chest wall: No tenderness.  Abdominal:     General: Bowel sounds are normal. There is no distension.     Palpations: Abdomen is soft. There is no mass.     Tenderness: There is no abdominal tenderness. There is no guarding or rebound.   Musculoskeletal:        General: No tenderness or deformity. Normal range of motion.     Cervical back: Normal range of motion and neck supple.  Lymphadenopathy:     Cervical: No cervical adenopathy.  Skin:    General: Skin is warm and dry.     Capillary Refill: Capillary refill takes less than 2 seconds.     Coloration: Skin is not pale.     Findings: No erythema or rash.  Neurological:     Mental Status: He is alert and oriented to person, place, and time.     Cranial Nerves: No cranial nerve deficit.     Motor: No abnormal muscle tone.     Coordination: Coordination normal.     Deep Tendon Reflexes: Reflexes are normal and symmetric.  Psychiatric:        Mood and Affect: Mood normal.        Behavior: Behavior normal. Behavior is cooperative.        Thought Content: Thought content normal.        Judgment: Judgment normal.       Assessment/Plan: 1. Encounter for general adult medical examination with abnormal findings Age-appropriate preventive screenings and vaccinations discussed, annual physical exam completed. Routine labs for health maintenance ordered, see below. PHM updated.   2. Type 2 diabetes mellitus with hyperglycemia, without long-term current use of insulin (HCC) A1C remains stable at 6.8, routine labs ordered.  - POCT glycosylated hemoglobin (Hb A1C) - CBC with Differential/Platelet - CMP14+EGFR  3. Essential (primary) hypertension Stable, continue as prescribed, refills ordered.  - hydrochlorothiazide (HYDRODIURIL) 12.5 MG tablet; Take 1 tablet (12.5 mg total) by mouth daily.  Dispense: 90 tablet; Refill: 3 - ramipril (ALTACE) 10 MG capsule; Take 1 capsule (10 mg total) by mouth daily.  Dispense: 90 capsule; Refill: 3  4. Gastro-esophageal reflux disease without esophagitis Refills ordered - omeprazole (PRILOSEC) 40 MG capsule; TAKE ONE CAPSULE DAILY FOR HEARTBURN  Dispense: 90 capsule; Refill:  3  5. Acute pain of left shoulder Refer to ortho and  meloxicam refills ordered.  - Ambulatory referral to Orthopedic Surgery - meloxicam (MOBIC) 7.5 MG tablet; Take 1 tablet (7.5 mg total) by mouth 2 (two) times daily.  Dispense: 60 tablet; Refill: 5  6. Primary generalized (osteo)arthritis Meloxicam refills ordered.  - meloxicam (MOBIC) 7.5 MG tablet; Take 1 tablet (7.5 mg total) by mouth 2 (two) times daily.  Dispense: 60 tablet; Refill: 5  7. Chronic neck pain Meloxicam refill sent - meloxicam (MOBIC) 7.5 MG tablet; Take 1 tablet (7.5 mg total) by mouth 2 (two) times daily.  Dispense: 60 tablet; Refill: 5  8. Nontoxic multinodular goiter Routine labs ordered.  - CBC with Differential/Platelet - TSH + free T4  9. Mixed hyperlipidemia Routine labs ordered, pravastatin refilled ordered. - CBC with Differential/Platelet - Lipid Profile - pravastatin (PRAVACHOL) 40 MG tablet; TAKE 1 TABLET BY MOUTH EVERYDAY AT BEDTIME  Dispense: 90 tablet; Refill: 3  10. Vitamin D deficiency Routine lab ordered.  - Vitamin D (25 hydroxy)  11. Dysuria Routine urinalysis done - UA/M w/rflx Culture, Routine - Microscopic Examination  12. Encounter for screening for malignant neoplasm of prostate Routine lab ordered. - PSA Total (Reflex To Free)  13. Need for vaccination - Zoster Vaccine Adjuvanted [facility] (1-Rh)) injection; Inject 0.5 mLs into the muscle once for 1 dose.  Dispense: 0.5 mL; Refill: 0     General Counseling: Joseph Stark verbalizes understanding of the findings of todays visit and agrees with plan of treatment. I have discussed any further diagnostic evaluation that may be needed or ordered today. We also reviewed his medications today. he has been encouraged to call the office with any questions or concerns that should arise related to todays visit.    Orders Placed This Encounter  Procedures   Microscopic Examination   UA/M w/rflx Culture, Routine   PSA Total (Reflex To Free)   CBC with Differential/Platelet   CMP14+EGFR   Lipid  Profile   TSH + free T4   Vitamin D (25 hydroxy)   Ambulatory referral to Orthopedic Surgery   POCT glycosylated hemoglobin (Hb A1C)    Meds ordered this encounter  Medications   Zoster Vaccine Adjuvanted [facility]) injection    Sig: Inject 0.5 mLs into the muscle once for 1 dose.    Dispense:  0.5 mL    Refill:  0   hydrochlorothiazide (HYDRODIURIL) 12.5 MG tablet    Sig: Take 1 tablet (12.5 mg total) by mouth daily.    Dispense:  90 tablet    Refill:  3   ramipril (ALTACE) 10 MG capsule    Sig: Take 1 capsule (10 mg total) by mouth daily.    Dispense:  90 capsule    Refill:  3   omeprazole (PRILOSEC) 40 MG capsule    Sig: TAKE ONE CAPSULE DAILY FOR HEARTBURN    Dispense:  90 capsule    Refill:  3   meloxicam (MOBIC) 7.5 MG tablet    Sig: Take 1 tablet (7.5 mg total) by mouth 2 (two) times daily.    Dispense:  60 tablet    Refill:  5   pravastatin (PRAVACHOL) 40 MG tablet    Sig: TAKE 1 TABLET BY MOUTH EVERYDAY AT BEDTIME    Dispense:  90 tablet    Refill:  3    Return in about 3 months (around 04/17/2022) for F/U, Recheck A1C, Regginald Pask PCP.   Total time spent:30 Minutes Time spent includes review  of chart, medications, test results, and follow up plan with the patient.   [place] Controlled Substance Database was reviewed by me.  This patient was seen by Jonetta Osgood, FNP-C in collaboration with Dr. Clayborn Bigness as a part of collaborative care agreement.  Joseph Stark R. Valetta Fuller, MSN, FNP-C Internal medicine

## 2022-01-19 ENCOUNTER — Telehealth: Payer: Self-pay

## 2022-01-19 LAB — UA/M W/RFLX CULTURE, ROUTINE
Bilirubin, UA: NEGATIVE
Ketones, UA: NEGATIVE
Leukocytes,UA: NEGATIVE
Nitrite, UA: NEGATIVE
Protein,UA: NEGATIVE
RBC, UA: NEGATIVE
Specific Gravity, UA: >=1.03 — AB (ref 1.005–1.030)
Urobilinogen, Ur: 0.2 mg/dL (ref 0.2–1.0)
pH, UA: 6.5 (ref 5.0–7.5)

## 2022-01-19 LAB — MICROSCOPIC EXAMINATION
Bacteria, UA: NONE SEEN
Casts: NONE SEEN /lpf
Epithelial Cells (non renal): NONE SEEN /hpf (ref 0–10)
RBC, Urine: NONE SEEN /hpf (ref 0–2)
WBC, UA: NONE SEEN /hpf (ref 0–5)

## 2022-01-19 NOTE — Telephone Encounter (Signed)
Awaiting 01/18/22 office notes for ortho surgery referral-Toni

## 2022-01-21 ENCOUNTER — Encounter: Payer: Self-pay | Admitting: Nurse Practitioner

## 2022-01-22 NOTE — Telephone Encounter (Signed)
Referral sent via [facility] to [facility]

## 2022-01-24 NOTE — Telephone Encounter (Signed)
Appointment> 02/09/22 @ 9:45-Toni

## 2022-02-01 LAB — TSH+FREE T4
Free T4: 1.12 ng/dL (ref 0.82–1.77)
TSH: 1.69 u[IU]/mL (ref 0.450–4.500)

## 2022-02-01 LAB — CBC WITH DIFFERENTIAL/PLATELET
Basophils Absolute: 0.1 10*3/uL (ref 0.0–0.2)
Basos: 1 %
EOS (ABSOLUTE): 0.2 10*3/uL (ref 0.0–0.4)
Eos: 2 %
Hematocrit: 43.8 % (ref 37.5–51.0)
Hemoglobin: 14.6 g/dL (ref 13.0–17.7)
Immature Grans (Abs): 0 10*3/uL (ref 0.0–0.1)
Immature Granulocytes: 0 %
Lymphocytes Absolute: 2 10*3/uL (ref 0.7–3.1)
Lymphs: 21 %
MCH: 30.8 pg (ref 26.6–33.0)
MCHC: 33.3 g/dL (ref 31.5–35.7)
MCV: 92 fL (ref 79–97)
Monocytes Absolute: 0.7 10*3/uL (ref 0.1–0.9)
Monocytes: 7 %
Neutrophils Absolute: 6.8 10*3/uL (ref 1.4–7.0)
Neutrophils: 69 %
Platelets: 263 10*3/uL (ref 150–450)
RBC: 4.74 x10E6/uL (ref 4.14–5.80)
RDW: 13.7 % (ref 11.6–15.4)
WBC: 9.7 10*3/uL (ref 3.4–10.8)

## 2022-02-01 LAB — LIPID PANEL
Chol/HDL Ratio: 4.3 ratio (ref 0.0–5.0)
Cholesterol, Total: 170 mg/dL (ref 100–199)
HDL: 40 mg/dL (ref >39)
LDL Chol Calc (NIH): 65 mg/dL (ref 0–99)
Triglycerides: 417 mg/dL — ABNORMAL HIGH (ref 0–149)
VLDL Cholesterol Cal: 65 mg/dL — ABNORMAL HIGH (ref 5–40)

## 2022-02-01 LAB — CMP14+EGFR
ALT: 30 IU/L (ref 0–44)
AST: 32 IU/L (ref 0–40)
Albumin/Globulin Ratio: 1.5 (ref 1.2–2.2)
Albumin: 4.8 g/dL (ref 3.8–4.9)
Alkaline Phosphatase: 78 IU/L (ref 44–121)
BUN/Creatinine Ratio: 12 (ref 9–20)
BUN: 14 mg/dL (ref 6–24)
Bilirubin Total: 0.3 mg/dL (ref 0.0–1.2)
CO2: 21 mmol/L (ref 20–29)
Calcium: 9.9 mg/dL (ref 8.7–10.2)
Chloride: 100 mmol/L (ref 96–106)
Creatinine, Ser: 1.16 mg/dL (ref 0.76–1.27)
Globulin, Total: 3.2 g/dL (ref 1.5–4.5)
Glucose: 122 mg/dL — ABNORMAL HIGH (ref 70–99)
Potassium: 4.7 mmol/L (ref 3.5–5.2)
Sodium: 144 mmol/L (ref 134–144)
Total Protein: 8 g/dL (ref 6.0–8.5)
eGFR: 73 mL/min/{1.73_m2} (ref >59)

## 2022-02-01 LAB — PSA TOTAL (REFLEX TO FREE): Prostate Specific Ag, Serum: 3.1 ng/mL (ref 0.0–4.0)

## 2022-02-01 LAB — VITAMIN D 25 HYDROXY (VIT D DEFICIENCY, FRACTURES): Vit D, 25-Hydroxy: 38.2 ng/mL (ref 30.0–100.0)

## 2022-02-09 ENCOUNTER — Telehealth: Payer: Self-pay

## 2022-02-09 NOTE — Telephone Encounter (Signed)
-----   Message from Jonetta Osgood, NP sent at 02/09/2022  1:55 PM EST ----- Please call patient with results: -- His metabolic panel is normal --His cholesterol levels are abnormal.  Total cholesterol, LDL and HDL are normal.  His triglyceride level is significantly elevated at 117 and his VLDL is significantly elevated at 65.  Please encourage patient to limit red meat intake, increase lean protein intake, and start taking an over-the-counter fish oil supplement of at least 1000 mg daily if he is not already doing so.  Some other interventions that will improve cholesterol levels are weight loss and increasing daily physical activity. --His PSA level is normal at 3.1.  Will repeat in 1 year. -- His CBC is normal, no anemia noted. --His thyroid levels are normal. --His vitamin D level is normal.  If patient is taking a vitamin D supplement he can continue if desired.

## 2022-02-09 NOTE — Progress Notes (Signed)
Please call patient with results: -- His metabolic panel is normal --His cholesterol levels are abnormal.  Total cholesterol, LDL and HDL are normal.  His triglyceride level is significantly elevated at 117 and his VLDL is significantly elevated at 65.  Please encourage patient to limit red meat intake, increase lean protein intake, and start taking an over-the-counter fish oil supplement of at least 1000 mg daily if he is not already doing so.  Some other interventions that will improve cholesterol levels are weight loss and increasing daily physical activity. --His PSA level is normal at 3.1.  Will repeat in 1 year. -- His CBC is normal, no anemia noted. --His thyroid levels are normal. --His vitamin D level is normal.  If patient is taking a vitamin D supplement he can continue if desired.

## 2022-02-15 ENCOUNTER — Other Ambulatory Visit: Payer: Self-pay

## 2022-02-15 ENCOUNTER — Ambulatory Visit: Payer: Medicaid Other | Admitting: Dermatology

## 2022-02-15 DIAGNOSIS — R234 Changes in skin texture: Secondary | ICD-10-CM | POA: Diagnosis not present

## 2022-02-15 DIAGNOSIS — L011 Impetiginization of other dermatoses: Secondary | ICD-10-CM | POA: Diagnosis not present

## 2022-02-15 DIAGNOSIS — L2089 Other atopic dermatitis: Secondary | ICD-10-CM

## 2022-02-15 DIAGNOSIS — L859 Epidermal thickening, unspecified: Secondary | ICD-10-CM

## 2022-02-15 DIAGNOSIS — L0100 Impetigo, unspecified: Secondary | ICD-10-CM

## 2022-02-15 MED ORDER — PROTOPIC 0.1 % EX OINT: TOPICAL_OINTMENT | Freq: Two times a day (BID) | CUTANEOUS | 3 refills | Status: DC

## 2022-02-15 MED ORDER — EUCRISA 2 % EX OINT: 1.0000 "application " | TOPICAL_OINTMENT | Freq: Every day | CUTANEOUS | 5 refills | Status: DC

## 2022-02-15 MED ORDER — MUPIROCIN 2 % EX OINT: 1.0000 "application " | TOPICAL_OINTMENT | Freq: Two times a day (BID) | CUTANEOUS | 3 refills | Status: DC

## 2022-02-15 NOTE — Progress Notes (Signed)
? ?  Follow-Up Visit ?  ?Subjective  ?Joseph Stark is a 61 y.o. male who presents for the following: Dermatitis (6 months f/u on dermatitis on the legs, treating with Eucrisa ointment with a fair response. ). ? ?The following portions of the chart were reviewed this encounter and updated as appropriate:  ? Tobacco  Allergies  Meds  Problems  Med Hx  Surg Hx  Fam Hx   ?  ?Review of Systems:  No other skin or systemic complaints except as noted in HPI or Assessment and Plan. ? ?Objective  ?Well appearing patient in no apparent distress; mood and affect are within normal limits. ? ?A focused examination was performed including lower legs. Relevant physical exam findings are noted in the Assessment and Plan. ? ?left leg ?Scaly erythematous papules and patches +/- dyspigmentation, lichenification, excoriations.  ? ? ?Assessment & Plan  ?Atopic dermatitis with yellow hyperkeratosis and some impetiginization and crusting ?legs ?Chronic and persistent condition with duration or expected duration over one year. Condition is symptomatic/ bothersome to patient. Not currently at goal.  ?Improved from baseline. Pt not using treatment regularly. ?  ?Atopic dermatitis (eczema) is a chronic, relapsing, pruritic condition that can significantly affect quality of life. It is often associated with allergic rhinitis and/or asthma and can require treatment with topical medications, phototherapy, or in severe cases a biologic medication called Dupixent in children and adults.  ?  ?Very similar exam to last appointment possibly slightly improved. ?  ?Skin debridement  ?Cleansed skin with Puracyn, applied Eucrisa ointment, non stick gauge, covered with coban wrap.  ? ?Start Protopic 0.1% oint qam.  ?Continue Eucrisa oint qhs.  ?Start Mupirocin oint bid to any scabs or sores. ?Encouraged pt to use medications more regularly for improved results. ?  ?Related Medications ?Crisaborole (EUCRISA) 2 % OINT ?Apply 1 application  topically at bedtime. ? ?mupirocin ointment (BACTROBAN) 2 % ?Apply 1 application topically 2 (two) times daily. To any open sores or scabs ? ?PROTOPIC 0.1 % ointment ?Apply topically 2 (two) times daily. Apply to affected skin qam ? ?Return in about 1 year (around 02/16/2023) for atopic dermatitis . ? ?I, Marye Round, CMA, am acting as scribe for Sarina Ser, MD .  ?Documentation: I have reviewed the above documentation for accuracy and completeness, and I agree with the above. ? ?Sarina Ser, MD ? ?

## 2022-02-15 NOTE — Patient Instructions (Signed)

## 2022-02-17 ENCOUNTER — Encounter: Payer: Self-pay | Admitting: Dermatology

## 2022-02-20 ENCOUNTER — Other Ambulatory Visit: Payer: Self-pay | Admitting: Nurse Practitioner

## 2022-02-20 DIAGNOSIS — E1165 Type 2 diabetes mellitus with hyperglycemia: Secondary | ICD-10-CM

## 2022-02-20 DIAGNOSIS — K219 Gastro-esophageal reflux disease without esophagitis: Secondary | ICD-10-CM

## 2022-02-20 DIAGNOSIS — M150 Primary generalized (osteo)arthritis: Secondary | ICD-10-CM

## 2022-02-20 DIAGNOSIS — M542 Cervicalgia: Secondary | ICD-10-CM

## 2022-02-20 DIAGNOSIS — G8929 Other chronic pain: Secondary | ICD-10-CM

## 2022-02-20 DIAGNOSIS — I10 Essential (primary) hypertension: Secondary | ICD-10-CM

## 2022-03-14 ENCOUNTER — Other Ambulatory Visit: Payer: Self-pay | Admitting: Nurse Practitioner

## 2022-03-14 DIAGNOSIS — M150 Primary generalized (osteo)arthritis: Secondary | ICD-10-CM

## 2022-03-14 DIAGNOSIS — K219 Gastro-esophageal reflux disease without esophagitis: Secondary | ICD-10-CM

## 2022-03-14 DIAGNOSIS — E1165 Type 2 diabetes mellitus with hyperglycemia: Secondary | ICD-10-CM

## 2022-03-14 DIAGNOSIS — G8929 Other chronic pain: Secondary | ICD-10-CM

## 2022-03-14 DIAGNOSIS — I10 Essential (primary) hypertension: Secondary | ICD-10-CM

## 2022-04-19 ENCOUNTER — Ambulatory Visit: Payer: Self-pay | Admitting: Nurse Practitioner

## 2022-04-19 ENCOUNTER — Encounter: Payer: Self-pay | Admitting: Nurse Practitioner

## 2022-04-19 VITALS — BP 107/68 | HR 76 | Temp 98.7°F | Resp 16 | Ht 71.0 in | Wt 205.0 lb

## 2022-04-19 DIAGNOSIS — I10 Essential (primary) hypertension: Secondary | ICD-10-CM

## 2022-04-19 DIAGNOSIS — E1165 Type 2 diabetes mellitus with hyperglycemia: Secondary | ICD-10-CM | POA: Diagnosis not present

## 2022-04-19 DIAGNOSIS — E782 Mixed hyperlipidemia: Secondary | ICD-10-CM | POA: Diagnosis not present

## 2022-04-19 LAB — POCT GLYCOSYLATED HEMOGLOBIN (HGB A1C): Hemoglobin A1C: 6.7 % — AB (ref 4.0–5.6)

## 2022-04-19 MED ORDER — OZEMPIC (1 MG/DOSE) 4 MG/3ML ~~LOC~~ SOPN: PEN_INJECTOR | SUBCUTANEOUS | 3 refills | Status: DC

## 2022-04-19 MED ORDER — METFORMIN HCL 1000 MG PO TABS: ORAL_TABLET | ORAL | 3 refills | Status: DC

## 2022-04-19 MED ORDER — METOPROLOL SUCCINATE ER 100 MG PO TB24: 100.0000 mg | ORAL_TABLET | Freq: Every day | ORAL | 3 refills | Status: DC

## 2022-04-19 NOTE — Progress Notes (Cosign Needed)
[facility] [place], [place] [address]  Internal MEDICINE  Office Visit Note  Patient Name: Joseph Stark  [id number]  [id number]  Date of Service: 04/19/2022  Chief Complaint  Patient presents with   Follow-up   Diabetes   Hypertension    HPI Braven presents for follow-up visit for hypertension and diabetes.  His blood pressure is within normal limits and well-controlled with current medications and his other vital signs are also stable and within normal limits.  His A1c is 6.7 today which is a slight improvement from his A1c of 6.8 in February earlier this year.  He continues to take his medication as prescribed and any needed refills will be sent today.  He reports that his A1c will continue to improve as it is getting warmer outside and he is starting to get outside more, doing yard work and more physical activity.    Current Medication: Outpatient Encounter Medications as of 04/19/2022  Medication Sig   Accu-Chek Softclix Lancets lancets USE AS DIRECTED TO CHECK BLOOD SUGARS THREE TIMES A DAY DX CODE E11.9   Crisaborole (EUCRISA) 2 % OINT Apply 1 application topically at bedtime.   FARXIGA 10 MG TABS tablet TAKE 1 TABLET BY MOUTH DAILY BEFORE BREAKFAST.   fenofibrate (TRICOR) 145 MG tablet Take 145 mg by mouth daily.   fexofenadine (ALLEGRA) 180 MG tablet Take 180 mg by mouth daily.   gabapentin (NEURONTIN) 300 MG capsule TAKE ONE CAPSULE BY MOUTH EVERY MORNING, 1 AT MIDDAY AND 2 AT AT BEDTIME   glimepiride (AMARYL) 4 MG tablet TAKE 1 TABLET BY MOUTH TWICE A DAY   glucose blood (ACCU-CHEK AVIVA PLUS) test strip CHECK BLOOD SUGARS 3 TIMES A DAY   hydrochlorothiazide (HYDRODIURIL) 12.5 MG tablet Take 1 tablet (12.5 mg total) by mouth daily.   meloxicam (MOBIC) 7.5 MG tablet Take 1 tablet (7.5 mg total) by mouth 2 (two) times daily.   morphine (MSIR) 30 MG tablet Take 30 mg by mouth every 12 (twelve) hours.   mupirocin ointment (BACTROBAN) 2 % Apply  1 application topically 2 (two) times daily. To any open sores or scabs   omeprazole (PRILOSEC) 40 MG capsule TAKE ONE CAPSULE DAILY FOR HEARTBURN   pneumococcal 23 valent vaccine (PNEUMOVAX 23) 25 MCG/0.5ML injection Inject 0.1m IM once   pravastatin (PRAVACHOL) 40 MG tablet TAKE 1 TABLET BY MOUTH EVERYDAY AT BEDTIME   promethazine (PHENERGAN) 12.5 MG tablet Take 12.5 mg by mouth 2 (two) times daily as needed for nausea or vomiting.   PROTOPIC 0.1 % ointment Apply topically 2 (two) times daily. Apply to affected skin qam   ramipril (ALTACE) 10 MG capsule Take 1 capsule (10 mg total) by mouth daily.   Ruxolitinib Phosphate (OPZELURA) 1.5 % CREA Apply 1 application topically 2 (two) times daily. Apply to skin twice a day   [DISCONTINUED] metFORMIN (GLUCOPHAGE) 1000 MG tablet TAKE 1 TABLET BY MOUTH IN AM AND TAKE 1/2 TABLET IN PM   [DISCONTINUED] metoprolol succinate (TOPROL-XL) 100 MG 24 hr tablet TAKE 1 TABLET BY MOUTH DAILY. TAKE WITH OR IMMEDIATELY FOLLOWING A MEAL.   [DISCONTINUED] OZEMPIC, 1 MG/DOSE, 4 MG/3ML SOPN INJECT '1MG'$  INTO THE SKIN ONCE A WEEK\   metFORMIN (GLUCOPHAGE) 1000 MG tablet TAKE 1 TABLET BY MOUTH IN AM AND TAKE 1/2 TABLET IN PM   metoprolol succinate (TOPROL-XL) 100 MG 24 hr tablet Take 1 tablet (100 mg total) by mouth daily. TAKE WITH OR IMMEDIATELY FOLLOWING A MEAL.   Semaglutide,  1 MG/DOSE, (OZEMPIC, 1 MG/DOSE,) 4 MG/3ML SOPN INJECT '1MG'$  INTO THE SKIN ONCE A WEEK\   No facility-administered encounter medications on file as of 04/19/2022.    Surgical History: History reviewed. No pertinent surgical history.  Medical History: Past Medical History:  Diagnosis Date   Compressed cervical disc    Diabetes mellitus without complication (HCC)    Hypertension    Renal disorder    Seizures ([place])     Family History: Family History  Problem Relation Age of Onset   Ovarian cancer Mother    Lymphoma Father     Social History   Socioeconomic History   Marital status:  Single    Spouse name: Not on file   Number of children: Not on file   Years of education: Not on file   Highest education level: Not on file  Occupational History   Not on file  Tobacco Use   Smoking status: Former    Types: Cigarettes   Smokeless tobacco: Never   Tobacco comments:    quit 30 years ago  Vaping Use   Vaping Use: Never used  Substance and Sexual Activity   Alcohol use: No   Drug use: No   Sexual activity: Not Currently  Other Topics Concern   Not on file  Social History Narrative   Not on file   Social Determinants of Health   Financial Resource Strain: Not on file  Food Insecurity: Not on file  Transportation Needs: Not on file  Physical Activity: Not on file  Stress: Not on file  Social Connections: Not on file  Intimate Partner Violence: Not on file      Review of Systems  Constitutional:  Negative for chills, fatigue and unexpected weight change.  HENT:  Negative for congestion, rhinorrhea, sneezing and sore throat.   Eyes:  Negative for redness.  Respiratory:  Negative for cough, chest tightness and shortness of breath.   Cardiovascular:  Negative for chest pain and palpitations.  Gastrointestinal:  Negative for abdominal pain, constipation, diarrhea, nausea and vomiting.  Genitourinary:  Negative for dysuria and frequency.  Musculoskeletal:  Negative for arthralgias, back pain, joint swelling and neck pain.  Skin:  Negative for rash.  Neurological: Negative.  Negative for tremors and numbness.  Hematological:  Negative for adenopathy. Does not bruise/bleed easily.  Psychiatric/Behavioral:  Negative for behavioral problems (Depression), sleep disturbance and suicidal ideas. The patient is not nervous/anxious.    Vital Signs: BP 107/68   Pulse 76   Temp 98.7 F (37.1 C)   Resp 16   Ht '5\' 11"'$  (1.803 m)   Wt 205 lb (93 kg)   SpO2 97%   BMI 28.59 kg/m    Physical Exam Vitals reviewed.  Constitutional:      General: He is not in acute  distress.    Appearance: Normal appearance. He is not ill-appearing.  HENT:     Head: Normocephalic and atraumatic.  Eyes:     Pupils: Pupils are equal, round, and reactive to light.  Cardiovascular:     Rate and Rhythm: Normal rate and regular rhythm.  Pulmonary:     Effort: Pulmonary effort is normal. No respiratory distress.  Neurological:     Mental Status: He is alert and oriented to person, place, and time.  Psychiatric:        Mood and Affect: Mood normal.        Behavior: Behavior normal.       Assessment/Plan: 1. Type 2 diabetes mellitus  with hyperglycemia, without long-term current use of insulin (HCC) Slight improvement of A1c to 6.7 compared to 6.8 in February earlier this year.  Continue Ozempic and metformin as prescribed, refills ordered.  Follow-up in 3 months for repeat A1c - POCT HgB A1C - Semaglutide, 1 MG/DOSE, (OZEMPIC, 1 MG/DOSE,) 4 MG/3ML SOPN; INJECT '1MG'$  INTO THE SKIN ONCE A WEEK\  Dispense: 12 mL; Refill: 3 - metFORMIN (GLUCOPHAGE) 1000 MG tablet; TAKE 1 TABLET BY MOUTH IN AM AND TAKE 1/2 TABLET IN PM  Dispense: 135 tablet; Refill: 3  2. Essential (primary) hypertension Blood pressure is well controlled with current medication: Metoprolol, ramipril and hydrochlorothiazide.  Metoprolol succinate refills ordered continue as prescribed - metoprolol succinate (TOPROL-XL) 100 MG 24 hr tablet; Take 1 tablet (100 mg total) by mouth daily. TAKE WITH OR IMMEDIATELY FOLLOWING A MEAL.  Dispense: 90 tablet; Refill: 3  3. Mixed hyperlipidemia Control of cholesterol levels is important especially in the presence of type 2 diabetes, continue pravastatin and fenofibrate as prescribed, refills are not needed at this time.   General Counseling: arlon bleier understanding of the findings of todays visit and agrees with plan of treatment. I have discussed any further diagnostic evaluation that may be needed or ordered today. We also reviewed his medications today. he has  been encouraged to call the office with any questions or concerns that should arise related to todays visit.    Orders Placed This Encounter  Procedures   POCT HgB A1C    Meds ordered this encounter  Medications   Semaglutide, 1 MG/DOSE, (OZEMPIC, 1 MG/DOSE,) 4 MG/3ML SOPN    Sig: INJECT '1MG'$  INTO THE SKIN ONCE A WEEK\    Dispense:  12 mL    Refill:  3   metoprolol succinate (TOPROL-XL) 100 MG 24 hr tablet    Sig: Take 1 tablet (100 mg total) by mouth daily. TAKE WITH OR IMMEDIATELY FOLLOWING A MEAL.    Dispense:  90 tablet    Refill:  3   metFORMIN (GLUCOPHAGE) 1000 MG tablet    Sig: TAKE 1 TABLET BY MOUTH IN AM AND TAKE 1/2 TABLET IN PM    Dispense:  135 tablet    Refill:  3    Please supply 90 day supply, for future refills    Return in about 3 months (around 07/20/2022) for F/U, Recheck A1C, Shamaine Mulkern PCP.   Total time spent:30 Minutes Time spent includes review of chart, medications, test results, and follow up plan with the patient.   [place] Controlled Substance Database was reviewed by me.  This patient was seen by Jonetta Osgood, FNP-C in collaboration with Dr. Clayborn Bigness as a part of collaborative care agreement.   Zeph Riebel R. Valetta Fuller, MSN, FNP-C Internal medicine

## 2022-05-21 ENCOUNTER — Other Ambulatory Visit: Payer: Self-pay | Admitting: Nurse Practitioner

## 2022-05-21 DIAGNOSIS — K219 Gastro-esophageal reflux disease without esophagitis: Secondary | ICD-10-CM

## 2022-05-21 DIAGNOSIS — E1165 Type 2 diabetes mellitus with hyperglycemia: Secondary | ICD-10-CM

## 2022-05-21 DIAGNOSIS — M150 Primary generalized (osteo)arthritis: Secondary | ICD-10-CM

## 2022-05-21 DIAGNOSIS — I10 Essential (primary) hypertension: Secondary | ICD-10-CM

## 2022-05-21 DIAGNOSIS — G8929 Other chronic pain: Secondary | ICD-10-CM

## 2022-06-03 ENCOUNTER — Encounter: Payer: Self-pay | Admitting: Nurse Practitioner

## 2022-06-12 ENCOUNTER — Other Ambulatory Visit: Payer: Self-pay | Admitting: Nurse Practitioner

## 2022-06-12 DIAGNOSIS — G8929 Other chronic pain: Secondary | ICD-10-CM

## 2022-06-12 DIAGNOSIS — M150 Primary generalized (osteo)arthritis: Secondary | ICD-10-CM

## 2022-06-12 DIAGNOSIS — E1165 Type 2 diabetes mellitus with hyperglycemia: Secondary | ICD-10-CM

## 2022-06-12 DIAGNOSIS — K219 Gastro-esophageal reflux disease without esophagitis: Secondary | ICD-10-CM

## 2022-06-12 DIAGNOSIS — I10 Essential (primary) hypertension: Secondary | ICD-10-CM

## 2022-07-16 ENCOUNTER — Other Ambulatory Visit: Payer: Self-pay | Admitting: Nurse Practitioner

## 2022-07-16 DIAGNOSIS — K219 Gastro-esophageal reflux disease without esophagitis: Secondary | ICD-10-CM

## 2022-07-16 DIAGNOSIS — M150 Primary generalized (osteo)arthritis: Secondary | ICD-10-CM

## 2022-07-16 DIAGNOSIS — I10 Essential (primary) hypertension: Secondary | ICD-10-CM

## 2022-07-16 DIAGNOSIS — E1165 Type 2 diabetes mellitus with hyperglycemia: Secondary | ICD-10-CM

## 2022-07-16 DIAGNOSIS — G8929 Other chronic pain: Secondary | ICD-10-CM

## 2022-07-19 ENCOUNTER — Ambulatory Visit: Payer: Medicaid Other | Admitting: Nurse Practitioner

## 2022-07-31 ENCOUNTER — Encounter: Payer: Self-pay | Admitting: Nurse Practitioner

## 2022-07-31 ENCOUNTER — Telehealth: Payer: Self-pay

## 2022-07-31 ENCOUNTER — Ambulatory Visit: Payer: Medicaid Other | Admitting: Nurse Practitioner

## 2022-07-31 VITALS — BP 130/70 | HR 75 | Temp 98.0°F | Resp 16 | Ht 71.0 in | Wt 206.6 lb

## 2022-07-31 DIAGNOSIS — H6123 Impacted cerumen, bilateral: Secondary | ICD-10-CM | POA: Diagnosis not present

## 2022-07-31 DIAGNOSIS — K219 Gastro-esophageal reflux disease without esophagitis: Secondary | ICD-10-CM

## 2022-07-31 DIAGNOSIS — E1165 Type 2 diabetes mellitus with hyperglycemia: Secondary | ICD-10-CM

## 2022-07-31 DIAGNOSIS — M25512 Pain in left shoulder: Secondary | ICD-10-CM

## 2022-07-31 DIAGNOSIS — Z760 Encounter for issue of repeat prescription: Secondary | ICD-10-CM | POA: Diagnosis not present

## 2022-07-31 DIAGNOSIS — M150 Primary generalized (osteo)arthritis: Secondary | ICD-10-CM

## 2022-07-31 DIAGNOSIS — I10 Essential (primary) hypertension: Secondary | ICD-10-CM

## 2022-07-31 DIAGNOSIS — G8929 Other chronic pain: Secondary | ICD-10-CM

## 2022-07-31 LAB — POCT GLYCOSYLATED HEMOGLOBIN (HGB A1C): Hemoglobin A1C: 6.8 % — AB (ref 4.0–5.6)

## 2022-07-31 MED ORDER — ACCU-CHEK SOFTCLIX LANCETS MISC: 6 refills | Status: DC

## 2022-07-31 MED ORDER — GLIMEPIRIDE 4 MG PO TABS: 4.0000 mg | ORAL_TABLET | Freq: Two times a day (BID) | ORAL | 1 refills | Status: DC

## 2022-07-31 MED ORDER — ACCU-CHEK AVIVA PLUS VI STRP: ORAL_STRIP | 3 refills | Status: DC

## 2022-07-31 MED ORDER — MELOXICAM 7.5 MG PO TABS: 7.5000 mg | ORAL_TABLET | Freq: Two times a day (BID) | ORAL | 3 refills | Status: DC

## 2022-07-31 MED ORDER — GABAPENTIN 300 MG PO CAPS: ORAL_CAPSULE | ORAL | 3 refills | Status: DC

## 2022-07-31 NOTE — Progress Notes (Signed)
[facility] [place], [place] [address]  Internal MEDICINE  Office Visit Note  Patient Name: Joseph Stark  [id number]  [id number]  Date of Service: 07/31/2022  Chief Complaint  Patient presents with   Follow-up   Hypertension   Diabetes   Ear Problem    Left ear full of wax, can not hear - tried at home remedies    HPI Williom presents for a follow-up visit for diabetes, hypertension and ear problems.  Diabetes Hypertension Ear problems --left ear feels clogged, like it is swollen down the left side of his neck.  History of cerumen impaction, has seen ENT previously, has already used drops to soften the wax Kidney stones and pain management.  The stones are made of calcium oxalate per report.  Takes p.o. morphine tablets long-term for recurrent kidney stones and pain management.  Also takes gabapentin and meloxicam.    Current Medication: Outpatient Encounter Medications as of 07/31/2022  Medication Sig   FARXIGA 10 MG TABS tablet TAKE 1 TABLET BY MOUTH EVERY DAY BEFORE BREAKFAST   fexofenadine (ALLEGRA) 180 MG tablet Take 180 mg by mouth daily.   hydrochlorothiazide (HYDRODIURIL) 12.5 MG tablet Take 1 tablet (12.5 mg total) by mouth daily.   metFORMIN (GLUCOPHAGE) 1000 MG tablet TAKE 1 TABLET BY MOUTH IN AM AND TAKE 1/2 TABLET IN PM   metoprolol succinate (TOPROL-XL) 100 MG 24 hr tablet Take 1 tablet (100 mg total) by mouth daily. TAKE WITH OR IMMEDIATELY FOLLOWING A MEAL.   morphine (MSIR) 30 MG tablet Take 30 mg by mouth every 12 (twelve) hours.   mupirocin ointment (BACTROBAN) 2 % Apply 1 application topically 2 (two) times daily. To any open sores or scabs   omeprazole (PRILOSEC) 40 MG capsule TAKE ONE CAPSULE DAILY FOR HEARTBURN   pravastatin (PRAVACHOL) 40 MG tablet TAKE 1 TABLET BY MOUTH EVERYDAY AT BEDTIME   promethazine (PHENERGAN) 12.5 MG tablet Take 12.5 mg by mouth 2 (two) times daily as needed for nausea or vomiting.   PROTOPIC 0.1  % ointment Apply topically 2 (two) times daily. Apply to affected skin qam   ramipril (ALTACE) 10 MG capsule Take 1 capsule (10 mg total) by mouth daily.   Semaglutide, 1 MG/DOSE, (OZEMPIC, 1 MG/DOSE,) 4 MG/3ML SOPN INJECT '1MG'$  INTO THE SKIN ONCE A WEEK\   [DISCONTINUED] Accu-Chek Softclix Lancets lancets USE AS DIRECTED TO CHECK BLOOD SUGARS THREE TIMES A DAY DX CODE E11.9   [DISCONTINUED] Crisaborole (EUCRISA) 2 % OINT Apply 1 application topically at bedtime.   [DISCONTINUED] fenofibrate (TRICOR) 145 MG tablet Take 145 mg by mouth daily.   [DISCONTINUED] gabapentin (NEURONTIN) 300 MG capsule TAKE ONE CAPSULE BY MOUTH EVERY MORNING, 1 AT MIDDAY AND 2 AT AT BEDTIME   [DISCONTINUED] glimepiride (AMARYL) 4 MG tablet TAKE 1 TABLET BY MOUTH TWICE A DAY   [DISCONTINUED] glucose blood (ACCU-CHEK AVIVA PLUS) test strip CHECK BLOOD SUGARS 3 TIMES A DAY   [DISCONTINUED] meloxicam (MOBIC) 7.5 MG tablet Take 1 tablet (7.5 mg total) by mouth 2 (two) times daily.   [DISCONTINUED] pneumococcal 23 valent vaccine (PNEUMOVAX 23) 25 MCG/0.5ML injection Inject 0.42m IM once   [DISCONTINUED] Ruxolitinib Phosphate (OPZELURA) 1.5 % CREA Apply 1 application topically 2 (two) times daily. Apply to skin twice a day   Accu-Chek Softclix Lancets lancets USE AS DIRECTED TO CHECK BLOOD SUGARS THREE TIMES A DAY DX CODE E11.9   gabapentin (NEURONTIN) 300 MG capsule TAKE ONE CAPSULE BY MOUTH EVERY MORNING, 1 AT  MIDDAY AND 2 AT AT BEDTIME   glimepiride (AMARYL) 4 MG tablet Take 1 tablet (4 mg total) by mouth 2 (two) times daily after a meal.   glucose blood (ACCU-CHEK AVIVA PLUS) test strip CHECK BLOOD SUGARS 3 TIMES A DAY   meloxicam (MOBIC) 7.5 MG tablet Take 1 tablet (7.5 mg total) by mouth 2 (two) times daily.   No facility-administered encounter medications on file as of 07/31/2022.    Surgical History: History reviewed. No pertinent surgical history.  Medical History: Past Medical History:  Diagnosis Date    Compressed cervical disc    Diabetes mellitus without complication (HCC)    Hypertension    Renal disorder    Seizures ([place])     Family History: Family History  Problem Relation Age of Onset   Ovarian cancer Mother    Lymphoma Father     Social History   Socioeconomic History   Marital status: Single    Spouse name: Not on file   Number of children: Not on file   Years of education: Not on file   Highest education level: Not on file  Occupational History   Not on file  Tobacco Use   Smoking status: Former    Types: Cigarettes   Smokeless tobacco: Never   Tobacco comments:    quit 30 years ago  Vaping Use   Vaping Use: Never used  Substance and Sexual Activity   Alcohol use: No   Drug use: No   Sexual activity: Not Currently  Other Topics Concern   Not on file  Social History Narrative   Not on file   Social Determinants of Health   Financial Resource Strain: Not on file  Food Insecurity: Not on file  Transportation Needs: Not on file  Physical Activity: Not on file  Stress: Not on file  Social Connections: Not on file  Intimate Partner Violence: Not on file      Review of Systems  Constitutional:  Negative for chills, fatigue and unexpected weight change.  HENT:  Positive for ear pain and hearing loss. Negative for congestion, rhinorrhea, sneezing and sore throat.   Eyes:  Negative for redness.  Respiratory:  Negative for cough, chest tightness and shortness of breath.   Cardiovascular:  Negative for chest pain and palpitations.  Gastrointestinal:  Negative for abdominal pain, constipation, diarrhea, nausea and vomiting.  Genitourinary:  Negative for dysuria and frequency.  Musculoskeletal:  Negative for arthralgias, back pain, joint swelling and neck pain.  Skin:  Negative for rash.  Neurological: Negative.  Negative for tremors and numbness.  Hematological:  Negative for adenopathy. Does not bruise/bleed easily.  Psychiatric/Behavioral:  Negative  for behavioral problems (Depression), sleep disturbance and suicidal ideas. The patient is not nervous/anxious.     Vital Signs: BP (!) 159/94   Pulse 75   Temp 98 F (36.7 C)   Resp 16   Ht '5\' 11"'$  (1.803 m)   Wt 206 lb 9.6 oz (93.7 kg)   SpO2 98%   BMI 28.81 kg/m    Physical Exam Vitals reviewed.  Constitutional:      General: He is not in acute distress.    Appearance: Normal appearance. He is not ill-appearing.  HENT:     Head: Normocephalic and atraumatic.     Right Ear: External ear normal. There is impacted cerumen.     Left Ear: External ear normal. There is impacted cerumen.  Eyes:     Pupils: Pupils are equal, round, and reactive  to light.  Cardiovascular:     Rate and Rhythm: Normal rate and regular rhythm.  Pulmonary:     Effort: Pulmonary effort is normal. No respiratory distress.  Neurological:     Mental Status: He is alert and oriented to person, place, and time.  Psychiatric:        Mood and Affect: Mood normal.        Behavior: Behavior normal.        Assessment/Plan: 1. Type 2 diabetes mellitus with hyperglycemia, without long-term current use of insulin (HCC) Stable, no significant change in a1c. Continue medications as prescribed.  - POCT HgB A1C  2. Bilateral hearing loss due to cerumen impaction Referred to ENT - Ambulatory referral to ENT  3. Medication refill - gabapentin (NEURONTIN) 300 MG capsule; TAKE ONE CAPSULE BY MOUTH EVERY MORNING, 1 AT MIDDAY AND 2 AT AT BEDTIME  Dispense: 360 capsule; Refill: 3 - glimepiride (AMARYL) 4 MG tablet; Take 1 tablet (4 mg total) by mouth 2 (two) times daily after a meal.  Dispense: 180 tablet; Refill: 1 - meloxicam (MOBIC) 7.5 MG tablet; Take 1 tablet (7.5 mg total) by mouth 2 (two) times daily.  Dispense: 180 tablet; Refill: 3 - Accu-Chek Softclix Lancets lancets; USE AS DIRECTED TO CHECK BLOOD SUGARS THREE TIMES A DAY DX CODE E11.9  Dispense: 100 each; Refill: 6   General Counseling: Akshat  verbalizes understanding of the findings of todays visit and agrees with plan of treatment. I have discussed any further diagnostic evaluation that may be needed or ordered today. We also reviewed his medications today. he has been encouraged to call the office with any questions or concerns that should arise related to todays visit.    Orders Placed This Encounter  Procedures   Ambulatory referral to ENT   POCT HgB A1C    Meds ordered this encounter  Medications   gabapentin (NEURONTIN) 300 MG capsule    Sig: TAKE ONE CAPSULE BY MOUTH EVERY MORNING, 1 AT MIDDAY AND 2 AT AT BEDTIME    Dispense:  360 capsule    Refill:  3    DX Code Needed  .   glimepiride (AMARYL) 4 MG tablet    Sig: Take 1 tablet (4 mg total) by mouth 2 (two) times daily after a meal.    Dispense:  180 tablet    Refill:  1   glucose blood (ACCU-CHEK AVIVA PLUS) test strip    Sig: CHECK BLOOD SUGARS 3 TIMES A DAY    Dispense:  100 strip    Refill:  3   meloxicam (MOBIC) 7.5 MG tablet    Sig: Take 1 tablet (7.5 mg total) by mouth 2 (two) times daily.    Dispense:  180 tablet    Refill:  3   Accu-Chek Softclix Lancets lancets    Sig: USE AS DIRECTED TO CHECK BLOOD SUGARS THREE TIMES A DAY DX CODE E11.9    Dispense:  100 each    Refill:  6    Should be covered by insurance 100% if not please let us know of an alternative that they do cover.    Return in about 3 months (around 10/31/2022) for F/U, Recheck A1C, Tichina Koebel PCP.   Total time spent:30 Minutes Time spent includes review of chart, medications, test results, and follow up plan with the patient.   [place] Controlled Substance Database was reviewed by me.  This patient was seen by Jonetta Osgood, FNP-C in collaboration with Dr. Clayborn Bigness as  a part of collaborative care agreement.   Anelly Samarin R. Valetta Fuller, MSN, FNP-C Internal medicine

## 2022-07-31 NOTE — Telephone Encounter (Signed)
Awaiting 07/31/22 office notes for ENT referral-[facility]

## 2022-08-15 ENCOUNTER — Telehealth: Payer: Self-pay

## 2022-08-15 NOTE — Telephone Encounter (Signed)
Otolaryngology referral sent via [facility] to [facility] 

## 2022-08-16 ENCOUNTER — Telehealth: Payer: Self-pay

## 2022-08-16 NOTE — Telephone Encounter (Signed)
Otolaryngology appt 08/28/22 @ [facility]

## 2022-09-05 ENCOUNTER — Other Ambulatory Visit: Payer: Self-pay | Admitting: Nurse Practitioner

## 2022-09-05 ENCOUNTER — Other Ambulatory Visit: Payer: Self-pay

## 2022-09-05 MED ORDER — ACCU-CHEK AVIVA PLUS W/DEVICE KIT: PACK | 1 refills | Status: DC

## 2022-09-05 NOTE — Telephone Encounter (Signed)
Please see

## 2022-09-06 ENCOUNTER — Other Ambulatory Visit: Payer: Self-pay

## 2022-09-06 MED ORDER — ACCU-CHEK GUIDE ME W/DEVICE KIT: PACK | 0 refills | Status: AC

## 2022-09-06 MED ORDER — ACCU-CHEK GUIDE VI STRP: ORAL_STRIP | 1 refills | Status: DC

## 2022-09-26 ENCOUNTER — Telehealth: Payer: Self-pay

## 2022-09-29 ENCOUNTER — Encounter: Payer: Self-pay | Admitting: Nurse Practitioner

## 2022-10-30 ENCOUNTER — Encounter: Payer: Self-pay | Admitting: Nurse Practitioner

## 2022-10-30 ENCOUNTER — Ambulatory Visit: Payer: Medicaid Other | Admitting: Nurse Practitioner

## 2022-10-30 VITALS — BP 138/70 | HR 76 | Temp 98.3°F | Resp 16 | Ht 71.0 in | Wt 207.0 lb

## 2022-10-30 DIAGNOSIS — E1165 Type 2 diabetes mellitus with hyperglycemia: Secondary | ICD-10-CM

## 2022-10-30 DIAGNOSIS — Z9181 History of falling: Secondary | ICD-10-CM

## 2022-10-30 DIAGNOSIS — K219 Gastro-esophageal reflux disease without esophagitis: Secondary | ICD-10-CM

## 2022-10-30 DIAGNOSIS — M5441 Lumbago with sciatica, right side: Secondary | ICD-10-CM

## 2022-10-30 DIAGNOSIS — I10 Essential (primary) hypertension: Secondary | ICD-10-CM

## 2022-10-30 DIAGNOSIS — M5442 Lumbago with sciatica, left side: Secondary | ICD-10-CM

## 2022-10-30 DIAGNOSIS — M150 Primary generalized (osteo)arthritis: Secondary | ICD-10-CM

## 2022-10-30 DIAGNOSIS — Z760 Encounter for issue of repeat prescription: Secondary | ICD-10-CM | POA: Diagnosis not present

## 2022-10-30 DIAGNOSIS — Z23 Encounter for immunization: Secondary | ICD-10-CM | POA: Diagnosis not present

## 2022-10-30 DIAGNOSIS — G8929 Other chronic pain: Secondary | ICD-10-CM

## 2022-10-30 LAB — POCT GLYCOSYLATED HEMOGLOBIN (HGB A1C): Hemoglobin A1C: 9.1 % — AB (ref 4.0–5.6)

## 2022-10-30 MED ORDER — DAPAGLIFLOZIN PROPANEDIOL 10 MG PO TABS: ORAL_TABLET | ORAL | 1 refills | Status: DC

## 2022-10-30 MED ORDER — DULAGLUTIDE 0.75 MG/0.5ML ~~LOC~~ SOAJ: 0.7500 mg | SUBCUTANEOUS | 2 refills | Status: DC

## 2022-10-30 MED ORDER — ACCU-CHEK SOFTCLIX LANCETS MISC: 6 refills | Status: DC

## 2022-10-30 NOTE — Progress Notes (Signed)
[facility] [place], [place] [address]  Internal MEDICINE  Office Visit Note  Patient Name: Joseph Stark  [id number]  [id number]  Date of Service: 10/30/2022  Chief Complaint  Patient presents with   Follow-up   Diabetes   Hypertension    HPI Joseph Stark presents for a follow up visit for hypertension and diabetes. He also fell about 1 week ago and did not go to the ER and has not had any imaging done.  Diabetes - A1c is significantly elevated 9.1 today. Not able to get ozempic for several weeks now due to backorders. Need alternative.  Hypertension -- controlled but slightly elevated today due to pain.  Recent fall -- fell backward out of a chair and hit his lower back hard with bruising. Hit his head but "not very hard". Declined head CT but agreed to imaging of low back.  Requests flu shot   Current Medication: Outpatient Encounter Medications as of 10/30/2022  Medication Sig   Blood Glucose Monitoring Suppl (ACCU-CHEK GUIDE ME) w/Device KIT Use as directed   Dulaglutide 0.75 MG/0.5ML SOPN Inject 0.75 mg into the skin once a week.   fexofenadine (ALLEGRA) 180 MG tablet Take 180 mg by mouth daily.   gabapentin (NEURONTIN) 300 MG capsule TAKE ONE CAPSULE BY MOUTH EVERY MORNING, 1 AT MIDDAY AND 2 AT AT BEDTIME   glimepiride (AMARYL) 4 MG tablet Take 1 tablet (4 mg total) by mouth 2 (two) times daily after a meal.   glucose blood (ACCU-CHEK GUIDE) test strip Use as instructed three  a day DX E11.65   hydrochlorothiazide (HYDRODIURIL) 12.5 MG tablet Take 1 tablet (12.5 mg total) by mouth daily.   meloxicam (MOBIC) 7.5 MG tablet Take 1 tablet (7.5 mg total) by mouth 2 (two) times daily.   metFORMIN (GLUCOPHAGE) 1000 MG tablet TAKE 1 TABLET BY MOUTH IN AM AND TAKE 1/2 TABLET IN PM   metoprolol succinate (TOPROL-XL) 100 MG 24 hr tablet Take 1 tablet (100 mg total) by mouth daily. TAKE WITH OR IMMEDIATELY FOLLOWING A MEAL.   morphine (MSIR) 30 MG tablet  Take 30 mg by mouth every 12 (twelve) hours.   mupirocin ointment (BACTROBAN) 2 % Apply 1 application topically 2 (two) times daily. To any open sores or scabs   omeprazole (PRILOSEC) 40 MG capsule TAKE ONE CAPSULE DAILY FOR HEARTBURN   pravastatin (PRAVACHOL) 40 MG tablet TAKE 1 TABLET BY MOUTH EVERYDAY AT BEDTIME   promethazine (PHENERGAN) 12.5 MG tablet Take 12.5 mg by mouth 2 (two) times daily as needed for nausea or vomiting.   PROTOPIC 0.1 % ointment Apply topically 2 (two) times daily. Apply to affected skin qam   ramipril (ALTACE) 10 MG capsule Take 1 capsule (10 mg total) by mouth daily.   [DISCONTINUED] Accu-Chek Softclix Lancets lancets USE AS DIRECTED TO CHECK BLOOD SUGARS THREE TIMES A DAY DX CODE E11.9   [DISCONTINUED] FARXIGA 10 MG TABS tablet TAKE 1 TABLET BY MOUTH EVERY DAY BEFORE BREAKFAST   [DISCONTINUED] Semaglutide, 1 MG/DOSE, (OZEMPIC, 1 MG/DOSE,) 4 MG/3ML SOPN INJECT 1MG INTO THE SKIN ONCE A WEEK\   Accu-Chek Softclix Lancets lancets USE AS DIRECTED TO CHECK BLOOD SUGARS THREE TIMES A DAY DX CODE E11.9   dapagliflozin propanediol (FARXIGA) 10 MG TABS tablet TAKE 1 TABLET BY MOUTH EVERY DAY BEFORE BREAKFAST   No facility-administered encounter medications on file as of 10/30/2022.    Surgical History: History reviewed. No pertinent surgical history.  Medical History: Past Medical History:  Diagnosis Date   Compressed cervical disc    Diabetes mellitus without complication (HCC)    Hypertension    Renal disorder    Seizures ([place])     Family History: Family History  Problem Relation Age of Onset   Ovarian cancer Mother    Lymphoma Father     Social History   Socioeconomic History   Marital status: Single    Spouse name: Not on file   Number of children: Not on file   Years of education: Not on file   Highest education level: Not on file  Occupational History   Not on file  Tobacco Use   Smoking status: Former    Types: Cigarettes   Smokeless  tobacco: Never   Tobacco comments:    quit 30 years ago  Vaping Use   Vaping Use: Never used  Substance and Sexual Activity   Alcohol use: No   Drug use: No   Sexual activity: Not Currently  Other Topics Concern   Not on file  Social History Narrative   Not on file   Social Determinants of Health   Financial Resource Strain: Not on file  Food Insecurity: Not on file  Transportation Needs: Not on file  Physical Activity: Not on file  Stress: Not on file  Social Connections: Not on file  Intimate Partner Violence: Not on file      Review of Systems  Constitutional:  Negative for chills, fatigue and unexpected weight change.  HENT:  Negative for congestion, rhinorrhea, sneezing and sore throat.   Eyes:  Negative for redness.  Respiratory:  Negative for cough, chest tightness and shortness of breath.   Cardiovascular:  Negative for chest pain and palpitations.  Gastrointestinal:  Negative for abdominal pain, constipation, diarrhea, nausea and vomiting.  Genitourinary:  Negative for dysuria and frequency.  Musculoskeletal:  Positive for arthralgias and back pain. Negative for joint swelling and neck pain.  Skin:  Negative for rash.  Neurological:  Positive for headaches. Negative for tremors and numbness.  Hematological:  Negative for adenopathy. Does not bruise/bleed easily.  Psychiatric/Behavioral:  Negative for behavioral problems (Depression), self-injury, sleep disturbance and suicidal ideas. The patient is not nervous/anxious.     Vital Signs: BP (!) 149/71   Pulse 76   Temp 98.3 F (36.8 C)   Resp 16   Ht _0  (1.803 m)   Wt 207 lb (93.9 kg)   SpO2 99%   BMI 28.87 kg/m    Physical Exam Vitals reviewed.  Constitutional:      General: He is not in acute distress.    Appearance: Normal appearance. He is not ill-appearing.  HENT:     Head: Normocephalic. Contusion (slight occipital bruising, declined head CT) present.  Eyes:     Pupils: Pupils are equal,  round, and reactive to light.  Cardiovascular:     Rate and Rhythm: Normal rate and regular rhythm.  Pulmonary:     Effort: Pulmonary effort is normal. No respiratory distress.  Musculoskeletal:     Lumbar back: Signs of trauma and tenderness (and bruising from fall) present.  Neurological:     Mental Status: He is alert and oriented to person, place, and time.  Psychiatric:        Mood and Affect: Mood normal.        Behavior: Behavior normal.        Assessment/Plan: 1. Type 2 diabetes mellitus with hyperglycemia, without long-term current use of insulin (HCC) A1c elevated 9.1.  not able to take ozempic for a while, still backordered. Discontinue ozempic. Trulicity prescribed. Follow up in 4 weeks.  - POCT glycosylated hemoglobin (Hb A1C) - Dulaglutide 0.75 MG/0.5ML SOPN; Inject 0.75 mg into the skin once a week.  Dispense: 2 mL; Refill: 2  2. Acute midline low back pain with bilateral sciatica xray of lumbar spine ordered - DG Lumbar Spine Complete; Future  3. History of recent fall Xray of lumbar spine ordered, declined head CT - DG Lumbar Spine Complete; Future  4. Medication refill - dapagliflozin propanediol (FARXIGA) 10 MG TABS tablet; TAKE 1 TABLET BY MOUTH EVERY DAY BEFORE BREAKFAST  Dispense: 90 tablet; Refill: 1 - Accu-Chek Softclix Lancets lancets; USE AS DIRECTED TO CHECK BLOOD SUGARS THREE TIMES A DAY DX CODE E11.9  Dispense: 100 each; Refill: 6  5. Needs flu shot Administered in office today - Flu Vaccine MDCK QUAD PF   General Counseling: Jacey verbalizes understanding of the findings of todays visit and agrees with plan of treatment. I have discussed any further diagnostic evaluation that may be needed or ordered today. We also reviewed his medications today. he has been encouraged to call the office with any questions or concerns that should arise related to todays visit.    Orders Placed This Encounter  Procedures   DG Lumbar Spine Complete   Flu  Vaccine MDCK QUAD PF   POCT glycosylated hemoglobin (Hb A1C)    Meds ordered this encounter  Medications   dapagliflozin propanediol (FARXIGA) 10 MG TABS tablet    Sig: TAKE 1 TABLET BY MOUTH EVERY DAY BEFORE BREAKFAST    Dispense:  90 tablet    Refill:  1    For future refills   Dulaglutide 0.75 MG/0.5ML SOPN    Sig: Inject 0.75 mg into the skin once a week.    Dispense:  2 mL    Refill:  2   Accu-Chek Softclix Lancets lancets    Sig: USE AS DIRECTED TO CHECK BLOOD SUGARS THREE TIMES A DAY DX CODE E11.9    Dispense:  100 each    Refill:  6    Should be covered by insurance 100% if not please let us know of an alternative that they do cover.    Return in about 4 weeks (around 11/27/2022) for F/U, eval new med, [place] PCP.   Total time spent:30 Minutes Time spent includes review of chart, medications, test results, and follow up plan with the patient.   [place] Controlled Substance Database was reviewed by me.  This patient was seen by Jonetta Osgood, FNP-C in collaboration with Dr. Clayborn Bigness as a part of collaborative care agreement.   Torben Soloway R. Valetta Fuller, MSN, FNP-C Internal medicine

## 2022-10-31 ENCOUNTER — Encounter: Payer: Self-pay | Admitting: Nurse Practitioner

## 2022-11-13 NOTE — Telephone Encounter (Signed)
done

## 2022-11-27 ENCOUNTER — Encounter: Payer: Self-pay | Admitting: Nurse Practitioner

## 2022-11-27 ENCOUNTER — Ambulatory Visit: Payer: Medicaid Other | Admitting: Nurse Practitioner

## 2022-11-27 VITALS — BP 145/79 | HR 88 | Temp 96.9°F | Resp 16 | Ht 71.0 in | Wt 208.2 lb

## 2022-11-27 DIAGNOSIS — I10 Essential (primary) hypertension: Secondary | ICD-10-CM

## 2022-11-27 DIAGNOSIS — M150 Primary generalized (osteo)arthritis: Secondary | ICD-10-CM | POA: Diagnosis not present

## 2022-11-27 DIAGNOSIS — E1165 Type 2 diabetes mellitus with hyperglycemia: Secondary | ICD-10-CM | POA: Diagnosis not present

## 2022-11-27 MED ORDER — DULAGLUTIDE 1.5 MG/0.5ML ~~LOC~~ SOAJ: 1.5000 mg | SUBCUTANEOUS | 1 refills | Status: DC

## 2022-11-27 NOTE — Progress Notes (Signed)
[facility] [place], [place] [address]  Internal MEDICINE  Office Visit Note  Patient Name: Joseph Stark  [id number]  [id number]  Date of Service: 11/27/2022  Chief Complaint  Patient presents with   Follow-up   Diabetes   Hypertension    HPI Joseph Stark presents for a follow up visit for diabetes and hypertension.  Diabetes -- was started on trulicity 2.87 mg weekly after he was unable to get his ozempic anymore. He is tolerating the medication without any issues or side effects. He is ready to increase the dose.  Hypertension -- BP is stable.  Osteoarthritis -- stable, tolerable, no change.      Current Medication: Outpatient Encounter Medications as of 11/27/2022  Medication Sig   Accu-Chek Softclix Lancets lancets USE AS DIRECTED TO CHECK BLOOD SUGARS THREE TIMES A DAY DX CODE E11.9   Blood Glucose Monitoring Suppl (ACCU-CHEK GUIDE ME) w/Device KIT Use as directed   dapagliflozin propanediol (FARXIGA) 10 MG TABS tablet TAKE 1 TABLET BY MOUTH EVERY DAY BEFORE BREAKFAST   Dulaglutide 1.5 MG/0.5ML SOPN Inject 1.5 mg into the skin once a week.   fexofenadine (ALLEGRA) 180 MG tablet Take 180 mg by mouth daily.   gabapentin (NEURONTIN) 300 MG capsule TAKE ONE CAPSULE BY MOUTH EVERY MORNING, 1 AT MIDDAY AND 2 AT AT BEDTIME   glimepiride (AMARYL) 4 MG tablet Take 1 tablet (4 mg total) by mouth 2 (two) times daily after a meal.   glucose blood (ACCU-CHEK GUIDE) test strip Use as instructed three  a day DX E11.65   hydrochlorothiazide (HYDRODIURIL) 12.5 MG tablet Take 1 tablet (12.5 mg total) by mouth daily.   meloxicam (MOBIC) 7.5 MG tablet Take 1 tablet (7.5 mg total) by mouth 2 (two) times daily.   metFORMIN (GLUCOPHAGE) 1000 MG tablet TAKE 1 TABLET BY MOUTH IN AM AND TAKE 1/2 TABLET IN PM   metoprolol succinate (TOPROL-XL) 100 MG 24 hr tablet Take 1 tablet (100 mg total) by mouth daily. TAKE WITH OR IMMEDIATELY FOLLOWING A MEAL.   morphine (MSIR)  30 MG tablet Take 30 mg by mouth every 12 (twelve) hours.   mupirocin ointment (BACTROBAN) 2 % Apply 1 application topically 2 (two) times daily. To any open sores or scabs   omeprazole (PRILOSEC) 40 MG capsule TAKE ONE CAPSULE DAILY FOR HEARTBURN   pravastatin (PRAVACHOL) 40 MG tablet TAKE 1 TABLET BY MOUTH EVERYDAY AT BEDTIME   promethazine (PHENERGAN) 12.5 MG tablet Take 12.5 mg by mouth 2 (two) times daily as needed for nausea or vomiting.   PROTOPIC 0.1 % ointment Apply topically 2 (two) times daily. Apply to affected skin qam   ramipril (ALTACE) 10 MG capsule Take 1 capsule (10 mg total) by mouth daily.   [DISCONTINUED] Dulaglutide 0.75 MG/0.5ML SOPN Inject 0.75 mg into the skin once a week.   No facility-administered encounter medications on file as of 11/27/2022.    Surgical History: History reviewed. No pertinent surgical history.  Medical History: Past Medical History:  Diagnosis Date   Compressed cervical disc    Diabetes mellitus without complication (HCC)    Hypertension    Renal disorder    Seizures ([place])     Family History: Family History  Problem Relation Age of Onset   Ovarian cancer Mother    Lymphoma Father     Social History   Socioeconomic History   Marital status: Single    Spouse name: Not on file   Number of children: Not on  file   Years of education: Not on file   Highest education level: Not on file  Occupational History   Not on file  Tobacco Use   Smoking status: Former    Types: Cigarettes   Smokeless tobacco: Never   Tobacco comments:    quit 30 years ago  Vaping Use   Vaping Use: Never used  Substance and Sexual Activity   Alcohol use: No   Drug use: No   Sexual activity: Not Currently  Other Topics Concern   Not on file  Social History Narrative   Not on file   Social Determinants of Health   Financial Resource Strain: Not on file  Food Insecurity: Not on file  Transportation Needs: Not on file  Physical Activity: Not on  file  Stress: Not on file  Social Connections: Not on file  Intimate Partner Violence: Not on file      Review of Systems  Constitutional:  Negative for chills, fatigue and unexpected weight change.  HENT:  Negative for congestion, rhinorrhea, sneezing and sore throat.   Eyes:  Negative for redness.  Respiratory:  Negative for cough, chest tightness and shortness of breath.   Cardiovascular:  Negative for chest pain and palpitations.  Gastrointestinal:  Negative for abdominal pain, constipation, diarrhea, nausea and vomiting.  Genitourinary:  Negative for dysuria and frequency.  Musculoskeletal:  Positive for arthralgias and back pain. Negative for joint swelling and neck pain.  Skin:  Negative for rash.  Neurological:  Positive for headaches. Negative for tremors and numbness.  Hematological:  Negative for adenopathy. Does not bruise/bleed easily.  Psychiatric/Behavioral:  Negative for behavioral problems (Depression), self-injury, sleep disturbance and suicidal ideas. The patient is not nervous/anxious.     Vital Signs: BP (!) 145/79   Pulse 88   Temp (!) 96.9 F (36.1 C)   Resp 16   Ht 5' 11" (1.803 m)   Wt 208 lb 3.2 oz (94.4 kg)   SpO2 97%   BMI 29.04 kg/m    Physical Exam Vitals reviewed.  Constitutional:      General: He is not in acute distress.    Appearance: Normal appearance. He is not ill-appearing.  HENT:     Head: Normocephalic and atraumatic.  Eyes:     Pupils: Pupils are equal, round, and reactive to light.  Cardiovascular:     Rate and Rhythm: Normal rate and regular rhythm.  Pulmonary:     Effort: Pulmonary effort is normal. No respiratory distress.  Neurological:     Mental Status: He is alert and oriented to person, place, and time.  Psychiatric:        Mood and Affect: Mood normal.        Behavior: Behavior normal.        Assessment/Plan: 1. Type 2 diabetes mellitus with hyperglycemia, without long-term current use of insulin  (HCC) Trulicity today increased to 1.5 mg weekly. Will follow up in 2 months to repeat A1c in February as scheduled for his annual physical.  - Dulaglutide 1.5 MG/0.5ML SOPN; Inject 1.5 mg into the skin once a week.  Dispense: 6 mL; Refill: 1  2. Essential (primary) hypertension Stable, continue medications as prescribed.   3. Primary generalized (osteo)arthritis Stable and tolerable   General Counseling: Finis verbalizes understanding of the findings of todays visit and agrees with plan of treatment. I have discussed any further diagnostic evaluation that may be needed or ordered today. We also reviewed his medications today. he has been encouraged  to call the office with any questions or concerns that should arise related to todays visit.    No orders of the defined types were placed in this encounter.   Meds ordered this encounter  Medications   Dulaglutide 1.5 MG/0.5ML SOPN    Sig: Inject 1.5 mg into the skin once a week.    Dispense:  6 mL    Refill:  1    Return in about 2 months (around 01/28/2023) for previously scheduled, CPE, Recheck A1C, Therese Rocco PCP.   Total time spent:30 Minutes Time spent includes review of chart, medications, test results, and follow up plan with the patient.   [place] Controlled Substance Database was reviewed by me.  This patient was seen by Jonetta Osgood, FNP-C in collaboration with Dr. Clayborn Bigness as a part of collaborative care agreement.   Alechia Lezama R. Valetta Fuller, MSN, FNP-C Internal medicine

## 2022-12-20 ENCOUNTER — Telehealth: Payer: Self-pay

## 2022-12-20 DIAGNOSIS — E1165 Type 2 diabetes mellitus with hyperglycemia: Secondary | ICD-10-CM

## 2022-12-26 ENCOUNTER — Other Ambulatory Visit: Payer: Self-pay | Admitting: Nurse Practitioner

## 2022-12-26 DIAGNOSIS — E1165 Type 2 diabetes mellitus with hyperglycemia: Secondary | ICD-10-CM

## 2022-12-26 MED ORDER — LIRAGLUTIDE 18 MG/3ML ~~LOC~~ SOPN: 0.6000 mg | PEN_INJECTOR | Freq: Every day | SUBCUTANEOUS | 5 refills | Status: DC

## 2022-12-27 MED ORDER — SEMAGLUTIDE (1 MG/DOSE) 4 MG/3ML ~~LOC~~ SOPN: 1.0000 mg | PEN_INJECTOR | SUBCUTANEOUS | 2 refills | Status: DC

## 2022-12-27 MED ORDER — OZEMPIC (0.25 OR 0.5 MG/DOSE) 2 MG/3ML ~~LOC~~ SOPN: 0.5000 mg | PEN_INJECTOR | SUBCUTANEOUS | 0 refills | Status: DC

## 2022-12-27 NOTE — Telephone Encounter (Signed)
Pt advised we send med  

## 2023-01-03 ENCOUNTER — Encounter: Payer: Medicaid Other | Admitting: Nurse Practitioner

## 2023-01-15 ENCOUNTER — Other Ambulatory Visit: Payer: Self-pay | Admitting: Nurse Practitioner

## 2023-01-15 DIAGNOSIS — K219 Gastro-esophageal reflux disease without esophagitis: Secondary | ICD-10-CM

## 2023-01-21 ENCOUNTER — Ambulatory Visit (INDEPENDENT_AMBULATORY_CARE_PROVIDER_SITE_OTHER): Payer: Medicaid Other | Admitting: Nurse Practitioner

## 2023-01-21 ENCOUNTER — Encounter: Payer: Self-pay | Admitting: Nurse Practitioner

## 2023-01-21 VITALS — BP 123/60 | HR 78 | Temp 98.3°F | Resp 16 | Ht 71.0 in | Wt 203.8 lb

## 2023-01-21 DIAGNOSIS — Z0001 Encounter for general adult medical examination with abnormal findings: Secondary | ICD-10-CM | POA: Diagnosis not present

## 2023-01-21 DIAGNOSIS — M5442 Lumbago with sciatica, left side: Secondary | ICD-10-CM | POA: Diagnosis not present

## 2023-01-21 DIAGNOSIS — E1165 Type 2 diabetes mellitus with hyperglycemia: Secondary | ICD-10-CM

## 2023-01-21 DIAGNOSIS — M5441 Lumbago with sciatica, right side: Secondary | ICD-10-CM | POA: Diagnosis not present

## 2023-01-21 DIAGNOSIS — G8929 Other chronic pain: Secondary | ICD-10-CM

## 2023-01-21 DIAGNOSIS — I10 Essential (primary) hypertension: Secondary | ICD-10-CM

## 2023-01-21 LAB — POCT GLYCOSYLATED HEMOGLOBIN (HGB A1C): Hemoglobin A1C: 8.2 % — AB (ref 4.0–5.6)

## 2023-01-21 NOTE — Progress Notes (Unsigned)
[facility] [place], [place] [address]  Internal MEDICINE  Office Visit Note  Patient Name: Joseph Stark  [id number]  [id number]  Date of Service: 01/21/2023  Chief Complaint  Patient presents with   Annual Exam   Diabetes   Hypertension    HPI Joseph Stark presents for an annual well visit and physical exam.  Well-appearing 62 y.o. male with diabetes, hypertension, GERD, asthma, osteoarthritis, low back pain, and high cholesterol.  Routine CRC screening: cologuard due in 2025 Eye exam: last seen in 2022 Foot exam: will do today Labs:  will get labs done  New or worsening pain: neck, back radiating down right arm.  Other concerns: working on getting help with transportation with [employer] filled out today for transportation. He has a history of chronic low back pain with bilateral sciatica, history of falls, scoliosis, peripheral neuropathy and his gait can be unsteady at times especially when walking distances longer than a quarter-mile.  Declined urine specimen for microalbumin today.  A1c improving to 8.2 today.      01/21/2023    9:11 AM  Depression screen PHQ 2/9  Decreased Interest 2  Down, Depressed, Hopeless 1  PHQ - 2 Score 3  Altered sleeping 0  Tired, decreased energy 0  Change in appetite 0  Feeling bad or failure about yourself  0  Trouble concentrating 0  Moving slowly or fidgety/restless 0  Suicidal thoughts 0  PHQ-9 Score 3  Difficult doing work/chores Not difficult at all    Functional Status Survey: Is the patient deaf or have difficulty hearing?: No Does the patient have difficulty seeing, even when wearing glasses/contacts?: No Does the patient have difficulty concentrating, remembering, or making decisions?: No Does the patient have difficulty walking or climbing stairs?: Yes Does the patient have difficulty dressing or bathing?: No Does the patient have difficulty doing errands alone such as visiting a doctor's  office or shopping?: No      01/18/2022   10:24 AM 04/19/2022    9:28 AM 07/31/2022   10:10 AM 10/30/2022    9:13 AM 01/21/2023    9:11 AM  Fall Risk  Falls in the past year? 1 0 0 1 1  Was there an injury with Fall? 1   0 1  Fall Risk Category Calculator '2   1 2  '$ Fall Risk Category (Retired) Moderate   Low   (RETIRED) Patient Fall Risk Level Moderate fall risk Low fall risk  Low fall risk   Patient at Risk for Falls Due to  No Fall Risks   History of fall(s);Impaired balance/gait;Impaired mobility  Fall risk Follow up Falls evaluation completed Falls evaluation completed  Falls evaluation completed Falls evaluation completed;Education provided;Falls prevention discussed      Current Medication: Outpatient Encounter Medications as of 01/21/2023  Medication Sig   Accu-Chek Softclix Lancets lancets USE AS DIRECTED TO CHECK BLOOD SUGARS THREE TIMES A DAY DX CODE E11.9   Blood Glucose Monitoring Suppl (ACCU-CHEK GUIDE ME) w/Device KIT Use as directed   dapagliflozin propanediol (FARXIGA) 10 MG TABS tablet TAKE 1 TABLET BY MOUTH EVERY DAY BEFORE BREAKFAST   fexofenadine (ALLEGRA) 180 MG tablet Take 180 mg by mouth daily.   gabapentin (NEURONTIN) 300 MG capsule TAKE ONE CAPSULE BY MOUTH EVERY MORNING, 1 AT MIDDAY AND 2 AT AT BEDTIME   glimepiride (AMARYL) 4 MG tablet Take 1 tablet (4 mg total) by mouth 2 (two) times daily after a meal.   glucose blood (  ACCU-CHEK GUIDE) test strip Use as instructed three  a day DX E11.65   hydrochlorothiazide (HYDRODIURIL) 12.5 MG tablet Take 1 tablet (12.5 mg total) by mouth daily.   meloxicam (MOBIC) 7.5 MG tablet Take 1 tablet (7.5 mg total) by mouth 2 (two) times daily.   metFORMIN (GLUCOPHAGE) 1000 MG tablet TAKE 1 TABLET BY MOUTH IN AM AND TAKE 1/2 TABLET IN PM   metoprolol succinate (TOPROL-XL) 100 MG 24 hr tablet Take 1 tablet (100 mg total) by mouth daily. TAKE WITH OR IMMEDIATELY FOLLOWING A MEAL.   morphine (MSIR) 30 MG tablet Take 30 mg by mouth every  12 (twelve) hours.   mupirocin ointment (BACTROBAN) 2 % Apply 1 application topically 2 (two) times daily. To any open sores or scabs   omeprazole (PRILOSEC) 40 MG capsule TAKE ONE CAPSULE DAILY FOR HEARTBURN   pravastatin (PRAVACHOL) 40 MG tablet TAKE 1 TABLET BY MOUTH EVERYDAY AT BEDTIME   promethazine (PHENERGAN) 12.5 MG tablet Take 12.5 mg by mouth 2 (two) times daily as needed for nausea or vomiting.   PROTOPIC 0.1 % ointment Apply topically 2 (two) times daily. Apply to affected skin qam   ramipril (ALTACE) 10 MG capsule Take 1 capsule (10 mg total) by mouth daily.   [START ON 01/24/2023] Semaglutide, 1 MG/DOSE, 4 MG/3ML SOPN Inject 1 mg as directed once a week.   Semaglutide,0.25 or 0.'5MG'$ /DOS, (OZEMPIC, 0.25 OR 0.5 MG/DOSE,) 2 MG/3ML SOPN Inject 0.5 mg into the skin once a week.   No facility-administered encounter medications on file as of 01/21/2023.    Surgical History: History reviewed. No pertinent surgical history.  Medical History: Past Medical History:  Diagnosis Date   Compressed cervical disc    Diabetes mellitus without complication (HCC)    Hypertension    Renal disorder    Seizures ([place])     Family History: Family History  Problem Relation Age of Onset   Ovarian cancer Mother    Lymphoma Father     Social History   Socioeconomic History   Marital status: Single    Spouse name: Not on file   Number of children: Not on file   Years of education: Not on file   Highest education level: Not on file  Occupational History   Not on file  Tobacco Use   Smoking status: Former    Types: Cigarettes   Smokeless tobacco: Never   Tobacco comments:    quit 30 years ago  Vaping Use   Vaping Use: Never used  Substance and Sexual Activity   Alcohol use: No   Drug use: No   Sexual activity: Not Currently  Other Topics Concern   Not on file  Social History Narrative   Not on file   Social Determinants of Health   Financial Resource Strain: Not on file  Food  Insecurity: Not on file  Transportation Needs: Not on file  Physical Activity: Not on file  Stress: Not on file  Social Connections: Not on file  Intimate Partner Violence: Not on file      Review of Systems  Constitutional:  Negative for activity change, appetite change, chills, fatigue, fever and unexpected weight change.  HENT: Negative.  Negative for congestion, ear pain, rhinorrhea, sore throat and trouble swallowing.   Eyes: Negative.   Respiratory: Negative.  Negative for cough, chest tightness, shortness of breath and wheezing.   Cardiovascular: Negative.  Negative for chest pain and palpitations.  Gastrointestinal: Negative.  Negative for abdominal pain, blood in  stool, constipation, diarrhea, nausea and vomiting.  Endocrine: Negative.   Genitourinary: Negative.  Negative for difficulty urinating, dysuria, frequency, hematuria and urgency.  Musculoskeletal:  Positive for arthralgias, back pain, gait problem (difficulty walking more than 1/4 mile, unsteady on uneven terrain) and neck pain (chronic since fall last year). Negative for joint swelling and myalgias.  Skin: Negative.  Negative for rash and wound.  Allergic/Immunologic: Negative.  Negative for immunocompromised state.  Neurological:  Negative for dizziness, seizures, numbness and headaches.  Hematological: Negative.   Psychiatric/Behavioral: Negative.  Negative for behavioral problems, self-injury and suicidal ideas. The patient is not nervous/anxious.     Vital Signs: BP 123/60   Pulse 78   Temp 98.3 F (36.8 C)   Resp 16   Ht '5\' 11"'$  (1.803 m)   Wt 203 lb 12.8 oz (92.4 kg)   SpO2 98%   BMI 28.42 kg/m    Physical Exam Vitals reviewed.  Constitutional:      General: He is awake. He is not in acute distress.    Appearance: Normal appearance. He is well-developed and well-groomed. He is obese. He is not ill-appearing or diaphoretic.  HENT:     Head: Normocephalic and atraumatic.     Right Ear: External  ear normal. There is impacted cerumen.     Left Ear: External ear normal. There is impacted cerumen.     Ears:     Comments: Sees ENT yearly for ear wash    Nose: Nose normal. No congestion or rhinorrhea.     Mouth/Throat:     Lips: Pink.     Mouth: Mucous membranes are moist.     Pharynx: Oropharynx is clear. Uvula midline. No oropharyngeal exudate or posterior oropharyngeal erythema.  Eyes:     General: Lids are normal. Vision grossly intact. Gaze aligned appropriately. No scleral icterus.       Right eye: No discharge.        Left eye: No discharge.     Extraocular Movements: Extraocular movements intact.     Conjunctiva/sclera: Conjunctivae normal.     Pupils: Pupils are equal, round, and reactive to light.     Funduscopic exam:    Right eye: Red reflex present.        Left eye: Red reflex present. Neck:     Thyroid: No thyromegaly.     Vascular: No JVD.     Trachea: Trachea and phonation normal. No tracheal deviation.  Cardiovascular:     Rate and Rhythm: Normal rate. Rhythm irregular.     Pulses: Normal pulses.          Dorsalis pedis pulses are 2+ on the right side and 2+ on the left side.       Posterior tibial pulses are 2+ on the right side and 2+ on the left side.     Heart sounds: Normal heart sounds, S1 normal and S2 normal. No murmur heard.    No friction rub. No gallop.  Pulmonary:     Effort: Pulmonary effort is normal. No respiratory distress.     Breath sounds: Normal breath sounds. No stridor. No wheezing or rales.  Chest:     Chest wall: No tenderness.  Abdominal:     General: Bowel sounds are normal. There is no distension.     Palpations: Abdomen is soft. There is no mass.     Tenderness: There is no abdominal tenderness. There is no guarding or rebound.  Musculoskeletal:  General: No tenderness or deformity. Normal range of motion.     Cervical back: Normal range of motion and neck supple.     Right foot: Normal range of motion. No deformity,  bunion, Charcot foot, foot drop or prominent metatarsal heads.     Left foot: Normal range of motion. Bunion present. No deformity, Charcot foot, foot drop or prominent metatarsal heads.  Feet:     Right foot:     Protective Sensation: 6 sites tested.  6 sites sensed.     Skin integrity: Callus, dry skin and fissure present. No ulcer, blister, skin breakdown, erythema or warmth.     Toenail Condition: Right toenails are abnormally thick and long.     Left foot:     Protective Sensation: 6 sites tested.  6 sites sensed.     Skin integrity: Callus and dry skin present. No ulcer, blister, skin breakdown, erythema, warmth or fissure.     Toenail Condition: Left toenails are abnormally thick and long.  Lymphadenopathy:     Cervical: No cervical adenopathy.  Skin:    General: Skin is warm and dry.     Capillary Refill: Capillary refill takes less than 2 seconds.     Coloration: Skin is not pale.     Findings: No erythema or rash.  Neurological:     Mental Status: He is alert and oriented to person, place, and time.     Cranial Nerves: No cranial nerve deficit.     Motor: No abnormal muscle tone.     Coordination: Coordination normal.     Gait: Gait abnormal (due to low back pain and sciatica).     Deep Tendon Reflexes: Reflexes are normal and symmetric.  Psychiatric:        Mood and Affect: Mood normal.        Behavior: Behavior normal. Behavior is cooperative.        Thought Content: Thought content normal.        Judgment: Judgment normal.    Diabetic Foot Exam - Simple   Simple Foot Form Diabetic Foot exam was performed with the following findings: Yes 01/21/2023  9:00 AM  Visual Inspection Sensation Testing Pulse Check Comments        Assessment/Plan: 1. Encounter for routine adult health examination with abnormal findings Age-appropriate preventive screenings and vaccinations discussed, annual physical exam completed. Routine labs for health maintenance ordered previously,  reminded patient to get labs drawn. PHM updated.    2. Type 2 diabetes mellitus with hyperglycemia, without long-term current use of insulin (HCC) A1c is improving, continue current medications as prescribed. Urine microalbumin ordered for him to do at [facility] when he gets his other labs drawn.  - POCT glycosylated hemoglobin (Hb A1C) - Urine Microalbumin w/creat. ratio  3. Essential (primary) hypertension Stable, continue medications as prescribed.   4. Chronic bilateral low back pain with bilateral sciatica Cane ordered to due to patient having chronic low back pain with sciatica which can cause his gait to become unsteady especially if he has to walk more than a quarter-mile.  - For home use only DME Cane     General Counseling: tyrann donaho understanding of the findings of todays visit and agrees with plan of treatment. I have discussed any further diagnostic evaluation that may be needed or ordered today. We also reviewed his medications today. he has been encouraged to call the office with any questions or concerns that should arise related to todays visit.    Orders  Placed This Encounter  Procedures   For home use only DME Cane   Urine Microalbumin w/creat. ratio   POCT glycosylated hemoglobin (Hb A1C)    No orders of the defined types were placed in this encounter.   Return in about 3 months (around 04/30/2023) for F/U, Recheck A1C, Sy Saintjean PCP.   Total time spent:30 Minutes Time spent includes review of chart, medications, test results, and follow up plan with the patient.   [place] Controlled Substance Database was reviewed by me.  This patient was seen by Jonetta Osgood, FNP-C in collaboration with Dr. Clayborn Bigness as a part of collaborative care agreement.  Kili Gracy R. Valetta Fuller, MSN, FNP-C Internal medicine

## 2023-01-22 ENCOUNTER — Telehealth: Payer: Self-pay

## 2023-01-22 ENCOUNTER — Encounter: Payer: Self-pay | Admitting: Nurse Practitioner

## 2023-01-22 NOTE — Telephone Encounter (Signed)
Faxed [facility] [phone] for cane

## 2023-02-11 ENCOUNTER — Other Ambulatory Visit: Payer: Self-pay

## 2023-02-11 ENCOUNTER — Other Ambulatory Visit: Payer: Self-pay | Admitting: Nurse Practitioner

## 2023-02-11 DIAGNOSIS — I10 Essential (primary) hypertension: Secondary | ICD-10-CM

## 2023-02-11 DIAGNOSIS — E1165 Type 2 diabetes mellitus with hyperglycemia: Secondary | ICD-10-CM

## 2023-02-11 MED ORDER — RAMIPRIL 10 MG PO CAPS: 10.0000 mg | ORAL_CAPSULE | Freq: Every day | ORAL | 3 refills | Status: DC

## 2023-02-20 ENCOUNTER — Ambulatory Visit: Payer: Medicaid Other | Admitting: Dermatology

## 2023-02-20 VITALS — BP 98/64 | HR 80

## 2023-02-20 DIAGNOSIS — L299 Pruritus, unspecified: Secondary | ICD-10-CM

## 2023-02-20 DIAGNOSIS — Z79899 Other long term (current) drug therapy: Secondary | ICD-10-CM | POA: Diagnosis not present

## 2023-02-20 DIAGNOSIS — L859 Epidermal thickening, unspecified: Secondary | ICD-10-CM | POA: Diagnosis not present

## 2023-02-20 DIAGNOSIS — L2081 Atopic neurodermatitis: Secondary | ICD-10-CM

## 2023-02-20 DIAGNOSIS — L981 Factitial dermatitis: Secondary | ICD-10-CM | POA: Diagnosis not present

## 2023-02-20 MED ORDER — TACROLIMUS 0.1 % EX OINT: TOPICAL_OINTMENT | Freq: Every day | CUTANEOUS | 6 refills | Status: AC

## 2023-02-20 MED ORDER — MUPIROCIN 2 % EX OINT: 1.0000 | TOPICAL_OINTMENT | Freq: Every day | CUTANEOUS | 11 refills | Status: AC

## 2023-02-20 MED ORDER — UREA 39.5 % EX CREA: 1.0000 | TOPICAL_CREAM | CUTANEOUS | 6 refills | Status: AC

## 2023-02-20 NOTE — Patient Instructions (Addendum)
For left lower leg Start Urea Urea 39.5% cr (sent in) to thick skin on left lower leg at night on Monday, Wednesday, and Fridays, if you can not get prescription you can buy Urea 40% cream over the counter at [facility] 0.1% ointment once a day to left lower leg until clear, then as needed for flares Cont Mupirocin oint once daily to open sores on left lower leg until healed      Due to recent changes in healthcare laws, you may see results of your pathology and/or laboratory studies on MyChart before the doctors have had a chance to review them. We understand that in some cases there may be results that are confusing or concerning to you. Please understand that not all results are received at the same time and often the doctors may need to interpret multiple results in order to provide you with the best plan of care or course of treatment. Therefore, we ask that you please give [place] 2 business days to thoroughly review all your results before contacting the office for clarification. Should we see a critical lab result, you will be contacted sooner.   If You Need Anything After Your Visit  If you have any questions or concerns for your doctor, please call our main line at [phone] and press option 4 to reach your doctor's medical assistant. If no one answers, please leave a voicemail as directed and we will return your call as soon as possible. Messages left after 4 pm will be answered the following business day.   You may also send [place] a message via [place]. We typically respond to MyChart messages within 1-2 business days.  For prescription refills, please ask your pharmacy to contact our office. Our fax number is [phone].  If you have an urgent issue when the clinic is closed that cannot wait until the next business day, you can page your doctor at the number below.    Please note that while we do our best to be available for urgent issues outside of office hours, we are  not available 24/7.   If you have an urgent issue and are unable to reach [place], you may choose to seek medical care at your doctor's office, retail clinic, urgent care center, or emergency room.  If you have a medical emergency, please immediately call 911 or go to the emergency department.  Pager Numbers  - Dr. Nehemiah Massed: [phone]  - Dr. Laurence Ferrari: [phone]  - Dr. Nicole Kindred: [phone]  In the event of inclement weather, please call our main line at [phone] for an update on the status of any delays or closures.  Dermatology Medication Tips: Please keep the boxes that topical medications come in in order to help keep track of the instructions about where and how to use these. Pharmacies typically print the medication instructions only on the boxes and not directly on the medication tubes.   If your medication is too expensive, please contact our office at [phone] option 4 or send [place] a message through [place].   We are unable to tell what your co-pay for medications will be in advance as this is different depending on your insurance coverage. However, we may be able to find a substitute medication at lower cost or fill out paperwork to get insurance to cover a needed medication.   If a prior authorization is required to get your medication covered by your insurance company, please allow [place] 1-2 business days to complete this process.  Drug prices often vary depending on where the prescription is filled and some pharmacies may offer cheaper prices.  The website www.goodrx.com contains coupons for medications through different pharmacies. The prices here do not account for what the cost may be with help from insurance (it may be cheaper with your insurance), but the website can give you the price if you did not use any insurance.  - You can print the associated coupon and take it with your prescription to the pharmacy.  - You may also stop by our office during regular business  hours and pick up a GoodRx coupon card.  - If you need your prescription sent electronically to a different pharmacy, notify our office through [facility] or by phone at [phone] option 4.     Si Usted Necesita Algo Despus de Su Visita  Tambin puede enviarnos un mensaje a travs de Pharmacist, community. Por lo general respondemos a los mensajes de MyChart en el transcurso de 1 a 2 das hbiles.  Para renovar recetas, por favor pida a su farmacia que se ponga en contacto con nuestra oficina. Harland Dingwall de fax es [place] [phone].  Si tiene un asunto urgente cuando la clnica est cerrada y que no puede esperar hasta el siguiente da hbil, puede llamar/localizar a su doctor(a) al nmero que aparece a continuacin.   Por favor, tenga en cuenta que aunque hacemos todo lo posible para estar disponibles para asuntos urgentes fuera del horario de [place], no estamos disponibles las 24 horas del da, los 7 das de la [place].   Si tiene un problema urgente y no puede comunicarse con nosotros, puede optar por buscar atencin mdica  en el consultorio de su doctor(a), en una clnica privada, en un centro de atencin urgente o en una sala de emergencias.  Si tiene Engineering geologist, por favor llame inmediatamente al 911 o vaya a la sala de emergencias.  Nmeros de bper  - Dr. Nehemiah Massed: [phone]  - Dra. Moye: [phone]  - Dra. Nicole Kindred: [phone]  En caso de inclemencias del [place], por favor llame a Johnsie Kindred principal al [phone] para una actualizacin sobre el [place] de cualquier retraso o cierre.  Consejos para la medicacin en dermatologa: Por favor, guarde las cajas en las que vienen los medicamentos de uso tpico para ayudarle a seguir las instrucciones sobre dnde y cmo usarlos. Las farmacias generalmente imprimen las instrucciones del medicamento slo en las cajas y no directamente en los tubos del [place].   Si su medicamento es muy caro, por favor,  pngase en contacto con Zigmund Daniel llamando al [phone] y presione la opcin 4 o envenos un mensaje a travs de Pharmacist, community.   No podemos decirle cul ser su copago por los medicamentos por adelantado ya que esto es diferente dependiendo de la cobertura de su seguro. Sin embargo, es posible que podamos encontrar un medicamento sustituto a Electrical engineer un formulario para que el seguro cubra el medicamento que se considera necesario.   Si se requiere una autorizacin previa para que su compaa de seguros [place] su medicamento, por favor permtanos de 1 a 2 das hbiles para completar este proceso.  Los precios de los medicamentos varan con frecuencia dependiendo del Environmental consultant de dnde se surte la receta y alguna farmacias pueden ofrecer precios ms baratos.  El sitio web www.goodrx.com tiene cupones para medicamentos de Airline pilot. Los precios aqu no tienen en cuenta lo que podra costar con la ayuda del seguro (puede ser ms  barato con su seguro), pero el sitio web puede darle el precio si no utiliz ningn seguro.  - Puede imprimir el cupn correspondiente y llevarlo con su receta a la farmacia.  - Tambin puede pasar por nuestra oficina durante el horario de atencin regular y recoger una tarjeta de cupones de GoodRx.  - Si necesita que su receta se enve electrnicamente a una farmacia diferente, informe a nuestra oficina a travs de MyChart de [place] o por telfono llamando al [phone] y presione la opcin 4.  

## 2023-02-20 NOTE — Progress Notes (Signed)
   Follow-Up Visit   Subjective  Joseph Stark is a 62 y.o. male who presents for the following: Eczema (Legs, pt not sure which medication he is using Protopic or Eucrisa, he thinks maybe Protopic qd, mupirocin to open sores, ).  The following portions of the chart were reviewed this encounter and updated as appropriate:   Tobacco  Allergies  Meds  Problems  Med Hx  Surg Hx  Fam Hx     Review of Systems:  No other skin or systemic complaints except as noted in HPI or Assessment and Plan.  Objective  Well appearing patient in no apparent distress; mood and affect are within normal limits.  A focused examination was performed including legs. Relevant physical exam findings are noted in the Assessment and Plan.  L lower leg Hyperkeratosis, crusting and erythema L lower leg      Assessment & Plan  Atopic neurodermatitis with pruritus and excoriations Atopic dermatitis with yellow hyperkeratosis and some impetiginization and crusting  L lower leg Chronic and persistent condition with duration or expected duration over one year. Condition is bothersome/symptomatic for patient. Currently flared. Atopic dermatitis (eczema) is a chronic, relapsing, pruritic condition that can significantly affect quality of life. It is often associated with allergic rhinitis and/or asthma and can require treatment with topical medications, phototherapy, or in severe cases biologic injectable medication (Dupixent; Adbry) or Oral JAK inhibitors.   Start Urea 40% cream (otc) or Urea 39.5% cr (sent in) to thick skin on left lower leg at night on Monday, Wednesday, and Fridays Continue Protopic 0.1% oint once a day to left lower leg Cont Mupirocin oint once daily to open sores; scabs  mupirocin ointment (BACTROBAN) 2 % - L lower leg Apply 1 Application topically daily. Apply once a day to open sores on legs tacrolimus (PROTOPIC) 0.1 % ointment - L lower leg Apply topically daily. Apply to eczema  on lower legs once a day until clear, then as needed for flares Urea 39.5 % CREA - L lower leg Apply 1 Application topically as directed. Apply to thick skin on left lower leg 3 nights a week, Monday, Wednesday and Friday, until resolved, then as needed for flares  Return in about 6 months (around 08/23/2023) for Atopic Derm.  I, Othelia Pulling, RMA, am acting as scribe for Sarina Ser, MD . Documentation: I have reviewed the above documentation for accuracy and completeness, and I agree with the above.  Sarina Ser, MD

## 2023-02-24 ENCOUNTER — Encounter: Payer: Self-pay | Admitting: Dermatology

## 2023-03-11 ENCOUNTER — Other Ambulatory Visit: Payer: Self-pay | Admitting: Nurse Practitioner

## 2023-03-11 DIAGNOSIS — Z760 Encounter for issue of repeat prescription: Secondary | ICD-10-CM

## 2023-03-13 ENCOUNTER — Telehealth: Payer: Self-pay

## 2023-03-13 NOTE — Telephone Encounter (Signed)
Requested Urea cream 39.5% is unavailable on backorder. Would you like me to send in 40% as it is available?

## 2023-03-14 NOTE — Telephone Encounter (Signed)
Called patient to let him know his options and there was no answer and no VM box setup to leave a message.

## 2023-03-24 IMAGING — CR DG KNEE COMPLETE 4+V*R*
4 series · 4 of 4 positions shown · non-contrast
Comparison: 12/22/2018

CLINICAL DATA: Fall with pain

EXAM:
RIGHT KNEE - COMPLETE 4+ VIEW

[knee ap]
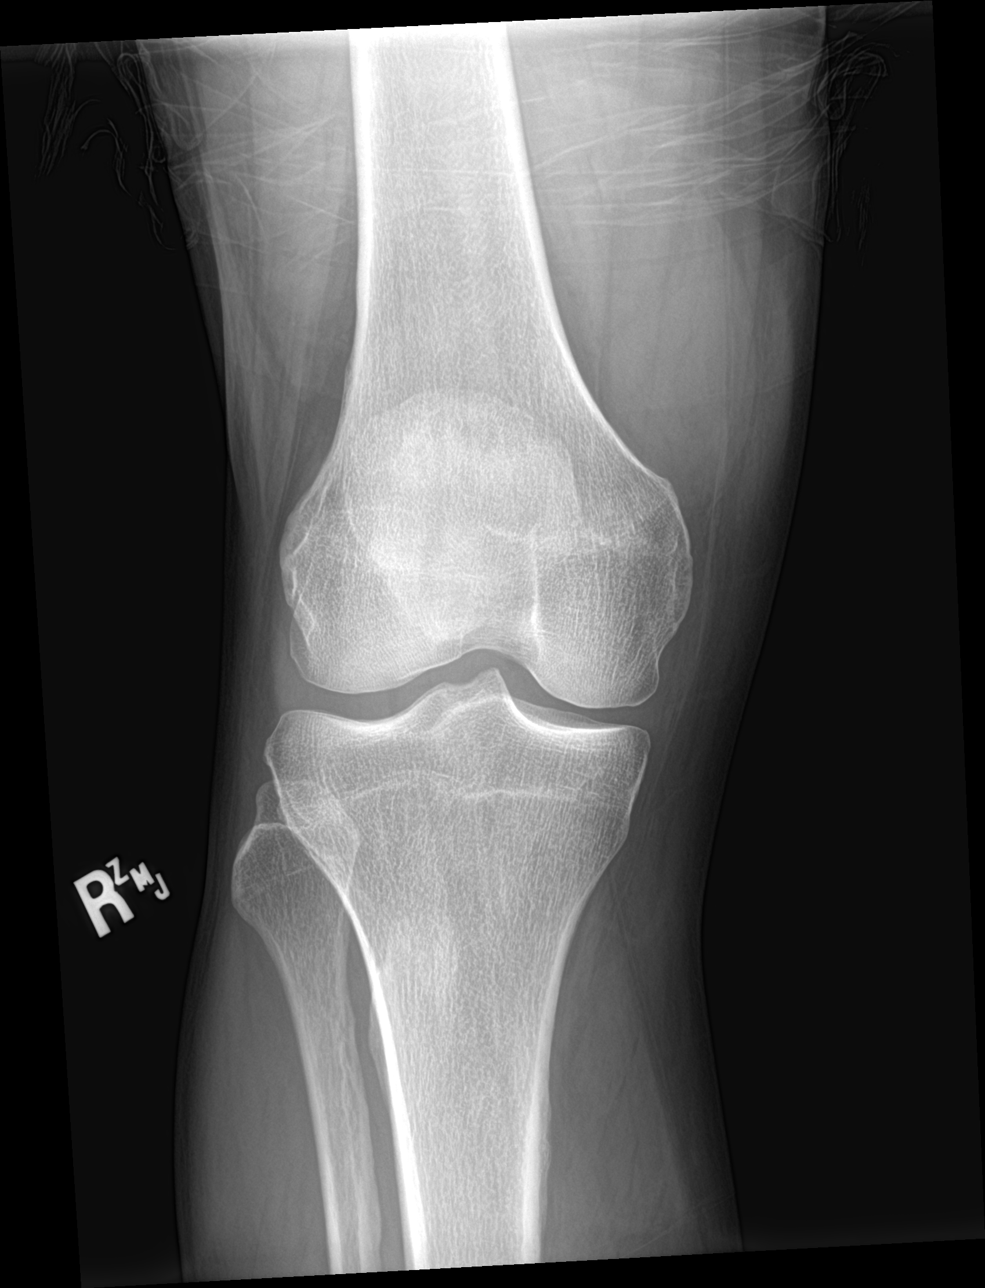

[knee obl (1 of 2)]
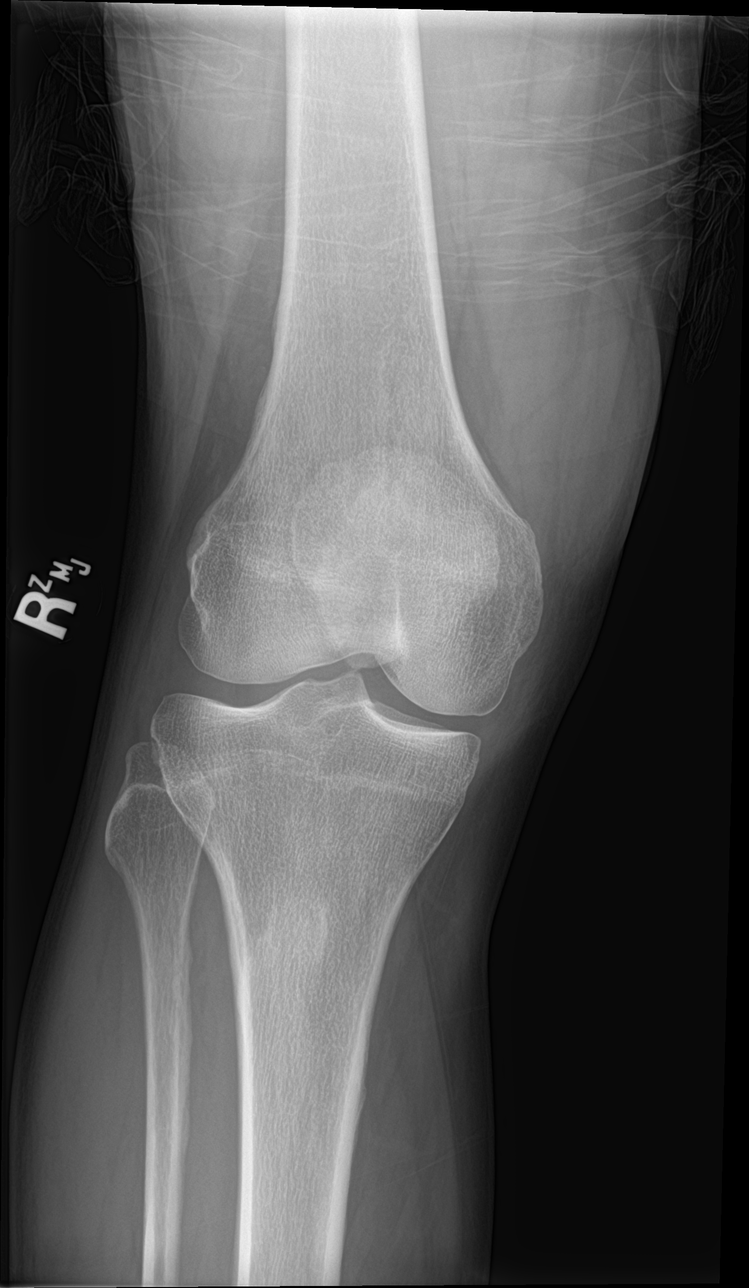

[knee obl (2 of 2)]
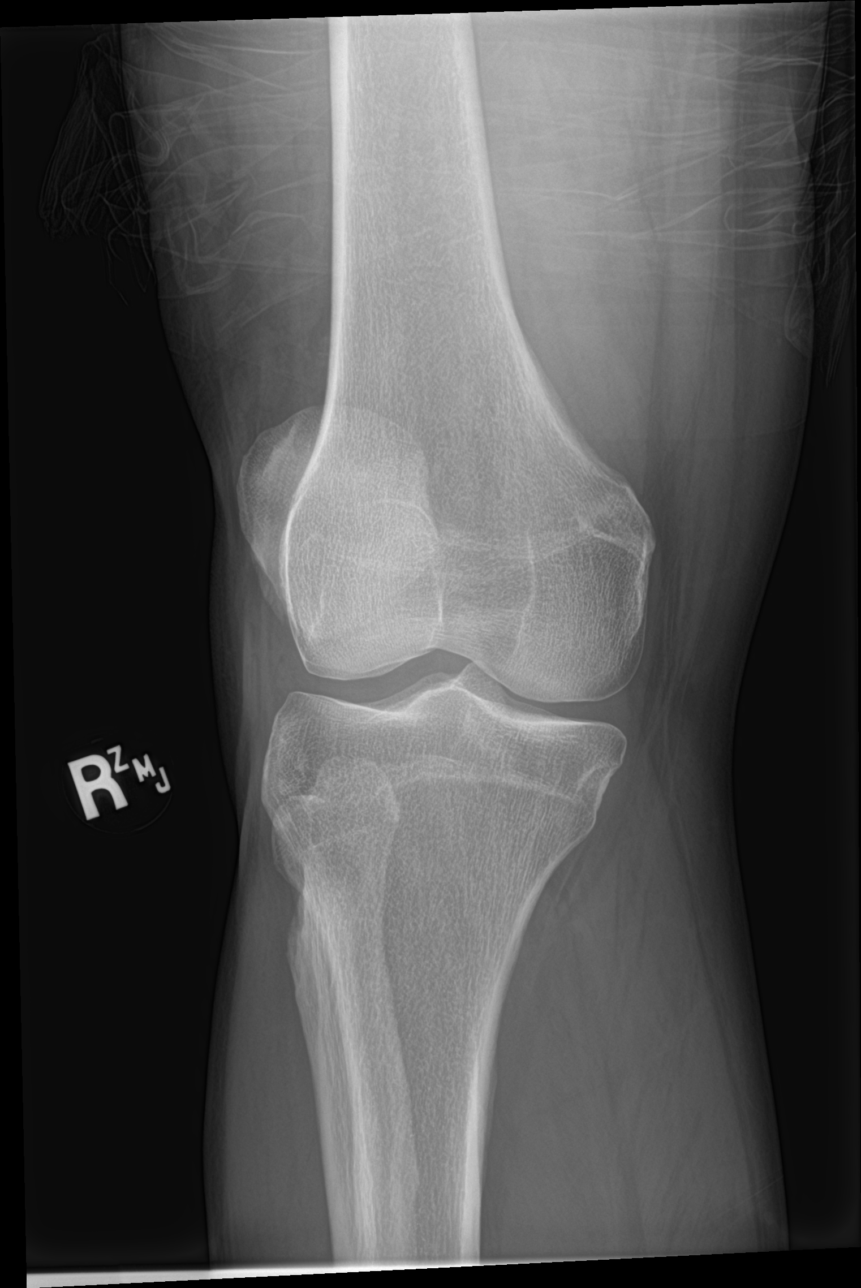

[knee lat]
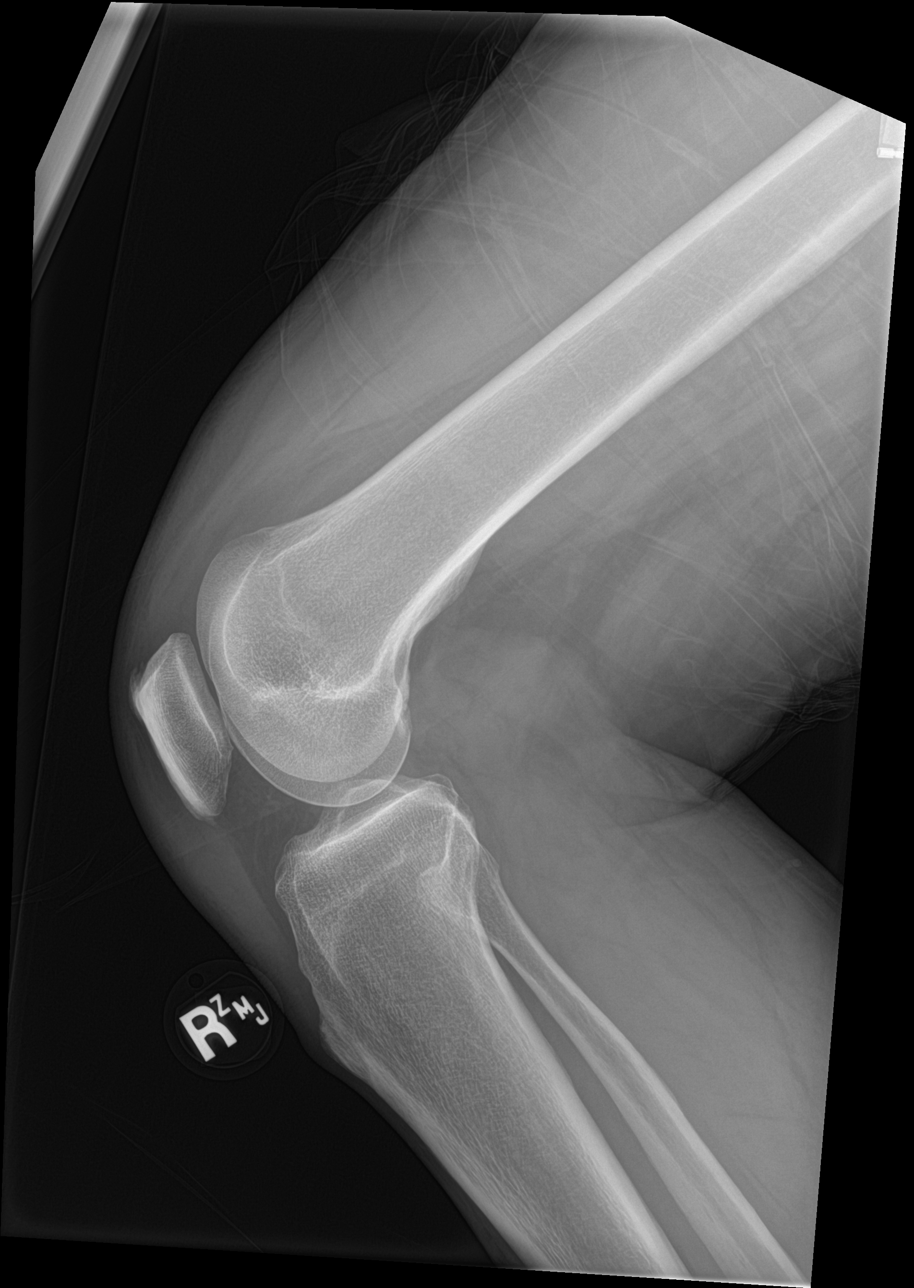

[4 of 4 positions shown; findings below may reference images not displayed]

FINDINGS: No evidence of fracture, dislocation, or joint effusion. No evidence
of arthropathy or other focal bone abnormality. Soft tissues are
unremarkable.
IMPRESSION: Negative.

## 2023-04-11 ENCOUNTER — Other Ambulatory Visit: Payer: Self-pay | Admitting: Nurse Practitioner

## 2023-04-11 DIAGNOSIS — E782 Mixed hyperlipidemia: Secondary | ICD-10-CM

## 2023-04-11 DIAGNOSIS — I10 Essential (primary) hypertension: Secondary | ICD-10-CM

## 2023-04-11 DIAGNOSIS — E1165 Type 2 diabetes mellitus with hyperglycemia: Secondary | ICD-10-CM

## 2023-04-22 ENCOUNTER — Ambulatory Visit: Payer: Medicaid Other | Admitting: Nurse Practitioner

## 2023-04-22 ENCOUNTER — Telehealth: Payer: Self-pay | Admitting: Nurse Practitioner

## 2023-04-22 ENCOUNTER — Encounter: Payer: Self-pay | Admitting: Nurse Practitioner

## 2023-04-22 VITALS — BP 130/70 | HR 68 | Temp 96.6°F | Resp 16 | Ht 71.0 in | Wt 204.2 lb

## 2023-04-22 DIAGNOSIS — E1165 Type 2 diabetes mellitus with hyperglycemia: Secondary | ICD-10-CM

## 2023-04-22 DIAGNOSIS — R29898 Other symptoms and signs involving the musculoskeletal system: Secondary | ICD-10-CM | POA: Diagnosis not present

## 2023-04-22 DIAGNOSIS — R200 Anesthesia of skin: Secondary | ICD-10-CM

## 2023-04-22 DIAGNOSIS — R519 Headache, unspecified: Secondary | ICD-10-CM

## 2023-04-22 DIAGNOSIS — Z87828 Personal history of other (healed) physical injury and trauma: Secondary | ICD-10-CM

## 2023-04-22 LAB — POCT GLYCOSYLATED HEMOGLOBIN (HGB A1C): Hemoglobin A1C: 6.9 % — AB (ref 4.0–5.6)

## 2023-04-22 NOTE — Telephone Encounter (Signed)
Awaiting 04/22/23 office notes for Neurology referral-Toni

## 2023-04-22 NOTE — Progress Notes (Signed)
[facility] [address] [place], [place] [address]  Internal MEDICINE  Office Visit Note  Patient Name: Joseph Stark  [id number]  [id number]  Date of Service: 04/22/2023  Chief Complaint  Patient presents with   Diabetes   Hypertension   Follow-up    HPI Joseph Stark presents for a follow-up visit for diabetes, falls, and leg weakness.  Feet are painful and going numb, almost fell recently, legs gave way when getting on bus, muscle weakness. Diabetes -- A1c improved to 6.9 -- takes farxiga, glimepiride, metformin and ozempic.  History of fall last year with minor head trauma, having headaches now and 1 episode of numbness from the waist down.    Current Medication: Outpatient Encounter Medications as of 04/22/2023  Medication Sig   Accu-Chek Softclix Lancets lancets USE AS DIRECTED TO CHECK BLOOD SUGARS THREE TIMES A DAY DX CODE E11.9   Blood Glucose Monitoring Suppl (ACCU-CHEK GUIDE ME) w/Device KIT Use as directed   dapagliflozin propanediol (FARXIGA) 10 MG TABS tablet TAKE 1 TABLET BY MOUTH EVERY DAY BEFORE BREAKFAST   fexofenadine (ALLEGRA) 180 MG tablet Take 180 mg by mouth daily.   gabapentin (NEURONTIN) 300 MG capsule TAKE ONE CAPSULE BY MOUTH EVERY MORNING, 1 AT MIDDAY AND 2 AT AT BEDTIME   glimepiride (AMARYL) 4 MG tablet TAKE 1 TABLET (4 MG TOTAL) BY MOUTH 2 (TWO) TIMES DAILY AFTER A MEAL.   glucose blood (ACCU-CHEK GUIDE) test strip Use as instructed three  a day DX E11.65   hydrochlorothiazide (HYDRODIURIL) 12.5 MG tablet TAKE 1 TABLET BY MOUTH EVERY DAY   meloxicam (MOBIC) 7.5 MG tablet Take 1 tablet (7.5 mg total) by mouth 2 (two) times daily.   metFORMIN (GLUCOPHAGE) 1000 MG tablet TAKE 1 TABLET BY MOUTH IN AM AND TAKE 1/2 TABLET IN PM   metoprolol succinate (TOPROL-XL) 100 MG 24 hr tablet TAKE 1 TABLET BY MOUTH DAILY. TAKE WITH OR IMMEDIATELY FOLLOWING A MEAL.   morphine (MSIR) 30 MG tablet Take 30 mg by mouth every 12 (twelve) hours.   mupirocin  ointment (BACTROBAN) 2 % Apply 1 Application topically daily. Apply once a day to open sores on legs   omeprazole (PRILOSEC) 40 MG capsule TAKE ONE CAPSULE DAILY FOR HEARTBURN   pravastatin (PRAVACHOL) 40 MG tablet TAKE 1 TABLET BY MOUTH EVERYDAY AT BEDTIME   promethazine (PHENERGAN) 12.5 MG tablet Take 12.5 mg by mouth 2 (two) times daily as needed for nausea or vomiting.   ramipril (ALTACE) 10 MG capsule Take 1 capsule (10 mg total) by mouth daily.   Semaglutide, 1 MG/DOSE, 4 MG/3ML SOPN Inject 1 mg as directed once a week.   tacrolimus (PROTOPIC) 0.1 % ointment Apply topically daily. Apply to eczema on lower legs once a day until clear, then as needed for flares   Urea 39.5 % CREA Apply 1 Application topically as directed. Apply to thick skin on left lower leg 3 nights a week, Monday, Wednesday and Friday, until resolved, then as needed for flares   [DISCONTINUED] Semaglutide,0.25 or 0.5MG /DOS, (OZEMPIC, 0.25 OR 0.5 MG/DOSE,) 2 MG/3ML SOPN Inject 0.5 mg into the skin once a week.   No facility-administered encounter medications on file as of 04/22/2023.    Surgical History: History reviewed. No pertinent surgical history.  Medical History: Past Medical History:  Diagnosis Date   Compressed cervical disc    Diabetes mellitus without complication (HCC)    Hypertension    Renal disorder    Seizures (HCC)  Family History: Family History  Problem Relation Age of Onset   Ovarian cancer Mother    Lymphoma Father     Social History   Socioeconomic History   Marital status: Single    Spouse name: Not on file   Number of children: Not on file   Years of education: Not on file   Highest education level: Not on file  Occupational History   Not on file  Tobacco Use   Smoking status: Former    Types: Cigarettes   Smokeless tobacco: Never   Tobacco comments:    quit 30 years ago  Vaping Use   Vaping Use: Never used  Substance and Sexual Activity   Alcohol use: No   Drug use:  No   Sexual activity: Not Currently  Other Topics Concern   Not on file  Social History Narrative   Not on file   Social Determinants of Health   Financial Resource Strain: Not on file  Food Insecurity: Not on file  Transportation Needs: Not on file  Physical Activity: Not on file  Stress: Not on file  Social Connections: Not on file  Intimate Partner Violence: Not on file      Review of Systems  Constitutional:  Negative for chills, fatigue and unexpected weight change.  HENT:  Negative for congestion, rhinorrhea, sneezing and sore throat.   Eyes:  Negative for redness.  Respiratory:  Negative for cough, chest tightness and shortness of breath.   Cardiovascular:  Negative for chest pain and palpitations.  Gastrointestinal:  Negative for abdominal pain, constipation, diarrhea, nausea and vomiting.  Genitourinary:  Negative for dysuria and frequency.  Musculoskeletal:  Positive for arthralgias and back pain. Negative for joint swelling and neck pain.  Skin:  Negative for rash.  Neurological:  Positive for headaches. Negative for tremors and numbness.  Hematological:  Negative for adenopathy. Does not bruise/bleed easily.  Psychiatric/Behavioral:  Negative for behavioral problems (Depression), self-injury, sleep disturbance and suicidal ideas. The patient is not nervous/anxious.     Vital Signs: BP 130/70   Pulse 68   Temp (!) 96.6 F (35.9 C)   Resp 16   Ht 5\' 11"  (1.803 m)   Wt 204 lb 3.2 oz (92.6 kg)   SpO2 93%   BMI 28.48 kg/m    Physical Exam Vitals reviewed.  Constitutional:      General: He is not in acute distress.    Appearance: Normal appearance. He is not ill-appearing.  HENT:     Head: Normocephalic and atraumatic.  Eyes:     Pupils: Pupils are equal, round, and reactive to light.  Cardiovascular:     Rate and Rhythm: Normal rate and regular rhythm.  Pulmonary:     Effort: Pulmonary effort is normal. No respiratory distress.  Neurological:      Mental Status: He is alert and oriented to person, place, and time.  Psychiatric:        Mood and Affect: Mood normal.        Behavior: Behavior normal.        Assessment/Plan: 1. Type 2 diabetes mellitus with hyperglycemia, without long-term current use of insulin (HCC) A1c improving, continue current medications as prescribed and diet/lifestyle modifications as instructed.  - POCT glycosylated hemoglobin (Hb A1C)  2. Transient weakness of lower extremity Referred to neurology - Ambulatory referral to Neurology  3. Lower extremity numbness Referred to neurology - Ambulatory referral to Neurology  4. Headache with remote history of traumatic head injury Referred  to neurology - Ambulatory referral to Neurology   General Counseling: jarren mitchum understanding of the findings of todays visit and agrees with plan of treatment. I have discussed any further diagnostic evaluation that may be needed or ordered today. We also reviewed his medications today. he has been encouraged to call the office with any questions or concerns that should arise related to todays visit.    Orders Placed This Encounter  Procedures   Ambulatory referral to Neurology   POCT glycosylated hemoglobin (Hb A1C)    No orders of the defined types were placed in this encounter.   Return in about 4 months (around 08/23/2023) for F/U, Recheck A1C, Hollie Bartus PCP.   Total time spent:30 Minutes Time spent includes review of chart, medications, test results, and follow up plan with the patient.   [place] Controlled Substance Database was reviewed by me.  This patient was seen by Sallyanne Kuster, FNP-C in collaboration with Dr. Beverely Risen as a part of collaborative care agreement.   Edgar Reisz R. Tedd Sias, MSN, FNP-C Internal medicine

## 2023-04-25 ENCOUNTER — Telehealth: Payer: Self-pay | Admitting: Nurse Practitioner

## 2023-04-25 NOTE — Telephone Encounter (Signed)
Neurology referral sent via [facility] to Dr. Shah with [facility]-Toni 

## 2023-05-08 ENCOUNTER — Other Ambulatory Visit: Payer: Self-pay | Admitting: Nurse Practitioner

## 2023-05-08 DIAGNOSIS — E1165 Type 2 diabetes mellitus with hyperglycemia: Secondary | ICD-10-CM

## 2023-05-08 DIAGNOSIS — Z760 Encounter for issue of repeat prescription: Secondary | ICD-10-CM

## 2023-05-08 NOTE — Telephone Encounter (Signed)
Please send farxiga

## 2023-05-14 ENCOUNTER — Telehealth: Payer: Self-pay | Admitting: Nurse Practitioner

## 2023-05-14 NOTE — Telephone Encounter (Signed)
Neurology appointment 07/12/2023 @ [facility]-Toni

## 2023-06-11 ENCOUNTER — Other Ambulatory Visit: Payer: Self-pay | Admitting: Nurse Practitioner

## 2023-06-11 ENCOUNTER — Other Ambulatory Visit: Payer: Self-pay

## 2023-06-11 DIAGNOSIS — Z760 Encounter for issue of repeat prescription: Secondary | ICD-10-CM

## 2023-06-11 DIAGNOSIS — E1165 Type 2 diabetes mellitus with hyperglycemia: Secondary | ICD-10-CM

## 2023-07-10 ENCOUNTER — Other Ambulatory Visit: Payer: Self-pay | Admitting: Nurse Practitioner

## 2023-07-10 DIAGNOSIS — E1165 Type 2 diabetes mellitus with hyperglycemia: Secondary | ICD-10-CM

## 2023-07-10 DIAGNOSIS — Z760 Encounter for issue of repeat prescription: Secondary | ICD-10-CM

## 2023-07-29 ENCOUNTER — Other Ambulatory Visit: Payer: Self-pay | Admitting: Neurology

## 2023-07-29 DIAGNOSIS — R569 Unspecified convulsions: Secondary | ICD-10-CM

## 2023-08-02 ENCOUNTER — Ambulatory Visit: Admission: RE | Admit: 2023-08-02 | Payer: Medicaid Other | Source: Ambulatory Visit

## 2023-08-02 DIAGNOSIS — R569 Unspecified convulsions: Secondary | ICD-10-CM

## 2023-08-11 ENCOUNTER — Other Ambulatory Visit: Payer: Self-pay | Admitting: Nurse Practitioner

## 2023-08-11 DIAGNOSIS — E1165 Type 2 diabetes mellitus with hyperglycemia: Secondary | ICD-10-CM

## 2023-08-14 ENCOUNTER — Other Ambulatory Visit: Payer: Self-pay | Admitting: Nurse Practitioner

## 2023-08-14 DIAGNOSIS — E1165 Type 2 diabetes mellitus with hyperglycemia: Secondary | ICD-10-CM

## 2023-08-14 DIAGNOSIS — Z760 Encounter for issue of repeat prescription: Secondary | ICD-10-CM

## 2023-08-23 ENCOUNTER — Encounter: Payer: Self-pay | Admitting: Nurse Practitioner

## 2023-08-23 ENCOUNTER — Ambulatory Visit: Payer: Medicaid Other | Admitting: Nurse Practitioner

## 2023-08-23 VITALS — BP 124/70 | HR 73 | Temp 98.3°F | Resp 16 | Ht 71.0 in | Wt 199.2 lb

## 2023-08-23 DIAGNOSIS — I10 Essential (primary) hypertension: Secondary | ICD-10-CM | POA: Diagnosis not present

## 2023-08-23 DIAGNOSIS — M150 Primary generalized (osteo)arthritis: Secondary | ICD-10-CM

## 2023-08-23 DIAGNOSIS — E1165 Type 2 diabetes mellitus with hyperglycemia: Secondary | ICD-10-CM | POA: Diagnosis not present

## 2023-08-23 LAB — POCT GLYCOSYLATED HEMOGLOBIN (HGB A1C): Hemoglobin A1C: 6.3 % — AB (ref 4.0–5.6)

## 2023-08-23 MED ORDER — OZEMPIC (1 MG/DOSE) 4 MG/3ML ~~LOC~~ SOPN
1.0000 mg | PEN_INJECTOR | SUBCUTANEOUS | 3 refills | Status: DC
Start: 2023-08-23 — End: 2024-06-08

## 2023-08-23 MED ORDER — DAPAGLIFLOZIN PROPANEDIOL 10 MG PO TABS
10.0000 mg | ORAL_TABLET | Freq: Every day | ORAL | 1 refills | Status: DC
Start: 2023-08-23 — End: 2024-02-10

## 2023-08-23 MED ORDER — ACCU-CHEK SOFTCLIX LANCETS MISC
6 refills | Status: AC
Start: 2023-08-23 — End: ?

## 2023-08-23 MED ORDER — ACCU-CHEK GUIDE VI STRP
ORAL_STRIP | 1 refills | Status: AC
Start: 2023-08-23 — End: ?

## 2023-08-23 NOTE — Progress Notes (Signed)
[facility] [address] [place], [place] [address]  Internal MEDICINE  Office Visit Note  Patient Name: Joseph Stark  [id number]  [id number]  Date of Service: 08/23/2023  Chief Complaint  Patient presents with   Diabetes   Hypertension   Follow-up    HPI Joseph Stark presents for a follow-up visit for diabetes, dermatitis, hypertension and arthritis.  Diabetes -- A1c is improving down to 6.3 today. Takes ozempic Atopic dermatitis -- seeing dermatology. Has left leg wrapped up with medication on it. Rash on right leg as well.  Hypertension -- controlled with current medications Arthritis -- takes prescription medication for arthritic pain. Has an abnormal gait and difficulty walking long distances.     Current Medication: Outpatient Encounter Medications as of 08/23/2023  Medication Sig   Blood Glucose Monitoring Suppl (ACCU-CHEK GUIDE ME) w/Device KIT Use as directed   fexofenadine (ALLEGRA) 180 MG tablet Take 180 mg by mouth daily.   gabapentin (NEURONTIN) 300 MG capsule TAKE ONE CAPSULE BY MOUTH EVERY MORNING, 1 AT MIDDAY AND 2 AT AT BEDTIME   glimepiride (AMARYL) 4 MG tablet TAKE 1 TABLET BY MOUTH TWICE A DAY AFTER MEAL   hydrochlorothiazide (HYDRODIURIL) 12.5 MG tablet TAKE 1 TABLET BY MOUTH EVERY DAY   meloxicam (MOBIC) 7.5 MG tablet TAKE 1 TABLET BY MOUTH 2 TIMES DAILY.   metFORMIN (GLUCOPHAGE) 1000 MG tablet TAKE 1 TABLET BY MOUTH IN AM AND TAKE 1/2 TABLET IN PM   metoprolol succinate (TOPROL-XL) 100 MG 24 hr tablet TAKE 1 TABLET BY MOUTH DAILY. TAKE WITH OR IMMEDIATELY FOLLOWING A MEAL.   morphine (MSIR) 30 MG tablet Take 30 mg by mouth every 12 (twelve) hours.   mupirocin ointment (BACTROBAN) 2 % Apply 1 Application topically daily. Apply once a day to open sores on legs   omeprazole (PRILOSEC) 40 MG capsule TAKE ONE CAPSULE DAILY FOR HEARTBURN   pravastatin (PRAVACHOL) 40 MG tablet TAKE 1 TABLET BY MOUTH EVERYDAY AT BEDTIME   promethazine (PHENERGAN)  12.5 MG tablet Take 12.5 mg by mouth 2 (two) times daily as needed for nausea or vomiting.   ramipril (ALTACE) 10 MG capsule Take 1 capsule (10 mg total) by mouth daily.   tacrolimus (PROTOPIC) 0.1 % ointment Apply topically daily. Apply to eczema on lower legs once a day until clear, then as needed for flares   Urea 39.5 % CREA Apply 1 Application topically as directed. Apply to thick skin on left lower leg 3 nights a week, Monday, Wednesday and Friday, until resolved, then as needed for flares   [DISCONTINUED] Accu-Chek Softclix Lancets lancets USE AS DIRECTED TO CHECK BLOOD SUGARS THREE TIMES A DAY DX CODE E11.9   [DISCONTINUED] FARXIGA 10 MG TABS tablet TAKE 1 TABLET BY MOUTH EVERY DAY BEFORE BREAKFAST   [DISCONTINUED] glucose blood (ACCU-CHEK GUIDE) test strip Use as instructed three  a day DX E11.65   [DISCONTINUED] Semaglutide, 1 MG/DOSE, (OZEMPIC, 1 MG/DOSE,) 4 MG/3ML SOPN INJECT 1 MG ONCE A WEEK AS DIRECTED   Accu-Chek Softclix Lancets lancets USE AS DIRECTED TO CHECK BLOOD SUGARS THREE TIMES A DAY DX CODE E11.9   dapagliflozin propanediol (FARXIGA) 10 MG TABS tablet Take 1 tablet (10 mg total) by mouth daily.   glucose blood (ACCU-CHEK GUIDE) test strip Use as instructed three  a day DX E11.65   Semaglutide, 1 MG/DOSE, (OZEMPIC, 1 MG/DOSE,) 4 MG/3ML SOPN Inject 1 mg into the skin once a week.   No facility-administered encounter medications on file as of 08/23/2023.  Surgical History: History reviewed. No pertinent surgical history.  Medical History: Past Medical History:  Diagnosis Date   Compressed cervical disc    Diabetes mellitus without complication (HCC)    Hypertension    Renal disorder    Seizures (HCC)     Family History: Family History  Problem Relation Age of Onset   Ovarian cancer Mother    Lymphoma Father     Social History   Socioeconomic History   Marital status: Single    Spouse name: Not on file   Number of children: Not on file   Years of  education: Not on file   Highest education level: Not on file  Occupational History   Not on file  Tobacco Use   Smoking status: Former    Types: Cigarettes   Smokeless tobacco: Never   Tobacco comments:    quit 30 years ago  Vaping Use   Vaping status: Never Used  Substance and Sexual Activity   Alcohol use: No   Drug use: No   Sexual activity: Not Currently  Other Topics Concern   Not on file  Social History Narrative   Not on file   Social Determinants of Health   Financial Resource Strain: Not on file  Food Insecurity: Not on file  Transportation Needs: Not on file  Physical Activity: Not on file  Stress: Not on file  Social Connections: Not on file  Intimate Partner Violence: Not on file      Review of Systems  Constitutional:  Negative for chills, fatigue and unexpected weight change.  HENT:  Negative for congestion, rhinorrhea, sneezing and sore throat.   Eyes:  Negative for redness.  Respiratory:  Negative for cough, chest tightness and shortness of breath.   Cardiovascular:  Negative for chest pain and palpitations.  Gastrointestinal:  Negative for abdominal pain, constipation, diarrhea, nausea and vomiting.  Genitourinary:  Negative for dysuria and frequency.  Musculoskeletal:  Positive for arthralgias and back pain. Negative for joint swelling and neck pain.  Skin:  Negative for rash.  Neurological:  Positive for headaches. Negative for tremors and numbness.  Hematological:  Negative for adenopathy. Does not bruise/bleed easily.  Psychiatric/Behavioral:  Negative for behavioral problems (Depression), self-injury, sleep disturbance and suicidal ideas. The patient is not nervous/anxious.     Vital Signs: BP 124/70   Pulse 73   Temp 98.3 F (36.8 C)   Resp 16   Ht 5\' 11"  (1.803 m)   Wt 199 lb 3.2 oz (90.4 kg)   SpO2 95%   BMI 27.78 kg/m    Physical Exam Vitals reviewed.  Constitutional:      General: He is not in acute distress.    Appearance:  Normal appearance. He is not ill-appearing.  HENT:     Head: Normocephalic and atraumatic.  Eyes:     Pupils: Pupils are equal, round, and reactive to light.  Cardiovascular:     Rate and Rhythm: Normal rate and regular rhythm.  Pulmonary:     Effort: Pulmonary effort is normal. No respiratory distress.  Neurological:     Mental Status: He is alert and oriented to person, place, and time.  Psychiatric:        Mood and Affect: Mood normal.        Behavior: Behavior normal.        Assessment/Plan: 1. Type 2 diabetes mellitus with hyperglycemia, without long-term current use of insulin (HCC) A1c improving, medication refills ordered, continue medications as prescribed.  -  POCT glycosylated hemoglobin (Hb A1C) - Accu-Chek Softclix Lancets lancets; USE AS DIRECTED TO CHECK BLOOD SUGARS THREE TIMES A DAY DX CODE E11.9  Dispense: 100 each; Refill: 6 - dapagliflozin propanediol (FARXIGA) 10 MG TABS tablet; Take 1 tablet (10 mg total) by mouth daily.  Dispense: 90 tablet; Refill: 1 - glucose blood (ACCU-CHEK GUIDE) test strip; Use as instructed three  a day DX E11.65  Dispense: 100 each; Refill: 1 - Semaglutide, 1 MG/DOSE, (OZEMPIC, 1 MG/DOSE,) 4 MG/3ML SOPN; Inject 1 mg into the skin once a week.  Dispense: 9 mL; Refill: 3  2. Essential (primary) hypertension Stable, continue medications as prescribed.   3. Primary generalized (osteo)arthritis Continue medications as prescribed.    General Counseling: lad plane understanding of the findings of todays visit and agrees with plan of treatment. I have discussed any further diagnostic evaluation that may be needed or ordered today. We also reviewed his medications today. he has been encouraged to call the office with any questions or concerns that should arise related to todays visit.    Orders Placed This Encounter  Procedures   POCT glycosylated hemoglobin (Hb A1C)    Meds ordered this encounter  Medications   Accu-Chek  Softclix Lancets lancets    Sig: USE AS DIRECTED TO CHECK BLOOD SUGARS THREE TIMES A DAY DX CODE E11.9    Dispense:  100 each    Refill:  6    Should be covered by insurance 100% if not please let us know of an alternative that they do cover.   dapagliflozin propanediol (FARXIGA) 10 MG TABS tablet    Sig: Take 1 tablet (10 mg total) by mouth daily.    Dispense:  90 tablet    Refill:  1   glucose blood (ACCU-CHEK GUIDE) test strip    Sig: Use as instructed three  a day DX E11.65    Dispense:  100 each    Refill:  1   Semaglutide, 1 MG/DOSE, (OZEMPIC, 1 MG/DOSE,) 4 MG/3ML SOPN    Sig: Inject 1 mg into the skin once a week.    Dispense:  9 mL    Refill:  3    Please fill for 90 days if possible    Return for previously scheduled, CPE, Ajooni Karam PCP in february.   Total time spent:30 Minutes Time spent includes review of chart, medications, test results, and follow up plan with the patient.   [place] Controlled Substance Database was reviewed by me.  This patient was seen by Sallyanne Kuster, FNP-C in collaboration with Dr. Beverely Risen as a part of collaborative care agreement.   Kerington Hildebrant R. Tedd Sias, MSN, FNP-C Internal medicine

## 2023-09-11 ENCOUNTER — Other Ambulatory Visit: Payer: Self-pay | Admitting: Nurse Practitioner

## 2023-09-11 DIAGNOSIS — E1165 Type 2 diabetes mellitus with hyperglycemia: Secondary | ICD-10-CM

## 2023-09-12 ENCOUNTER — Ambulatory Visit: Payer: Medicaid Other | Admitting: Dermatology

## 2024-01-11 ENCOUNTER — Other Ambulatory Visit: Payer: Self-pay | Admitting: Nurse Practitioner

## 2024-01-11 DIAGNOSIS — K219 Gastro-esophageal reflux disease without esophagitis: Secondary | ICD-10-CM

## 2024-01-27 ENCOUNTER — Encounter: Payer: Medicaid Other | Admitting: Nurse Practitioner

## 2024-02-06 ENCOUNTER — Ambulatory Visit: Payer: Medicaid Other | Admitting: Dermatology

## 2024-02-08 ENCOUNTER — Other Ambulatory Visit: Payer: Self-pay | Admitting: Nurse Practitioner

## 2024-02-08 DIAGNOSIS — E1165 Type 2 diabetes mellitus with hyperglycemia: Secondary | ICD-10-CM

## 2024-02-09 ENCOUNTER — Other Ambulatory Visit: Payer: Self-pay | Admitting: Nurse Practitioner

## 2024-02-09 DIAGNOSIS — I10 Essential (primary) hypertension: Secondary | ICD-10-CM

## 2024-02-10 ENCOUNTER — Encounter: Payer: Self-pay | Admitting: Nurse Practitioner

## 2024-02-10 ENCOUNTER — Telehealth: Payer: Self-pay | Admitting: Nurse Practitioner

## 2024-02-10 ENCOUNTER — Ambulatory Visit (INDEPENDENT_AMBULATORY_CARE_PROVIDER_SITE_OTHER): Payer: Medicaid Other | Admitting: Nurse Practitioner

## 2024-02-10 VITALS — BP 132/76 | HR 70 | Temp 98.2°F | Resp 16 | Ht 71.0 in | Wt 203.2 lb

## 2024-02-10 DIAGNOSIS — Z0001 Encounter for general adult medical examination with abnormal findings: Secondary | ICD-10-CM | POA: Diagnosis not present

## 2024-02-10 DIAGNOSIS — E559 Vitamin D deficiency, unspecified: Secondary | ICD-10-CM | POA: Diagnosis not present

## 2024-02-10 DIAGNOSIS — I10 Essential (primary) hypertension: Secondary | ICD-10-CM

## 2024-02-10 DIAGNOSIS — E1159 Type 2 diabetes mellitus with other circulatory complications: Secondary | ICD-10-CM | POA: Diagnosis not present

## 2024-02-10 DIAGNOSIS — Z1211 Encounter for screening for malignant neoplasm of colon: Secondary | ICD-10-CM

## 2024-02-10 DIAGNOSIS — G8929 Other chronic pain: Secondary | ICD-10-CM

## 2024-02-10 DIAGNOSIS — M5441 Lumbago with sciatica, right side: Secondary | ICD-10-CM

## 2024-02-10 DIAGNOSIS — E785 Hyperlipidemia, unspecified: Secondary | ICD-10-CM

## 2024-02-10 DIAGNOSIS — M5442 Lumbago with sciatica, left side: Secondary | ICD-10-CM | POA: Diagnosis not present

## 2024-02-10 DIAGNOSIS — E1169 Type 2 diabetes mellitus with other specified complication: Secondary | ICD-10-CM

## 2024-02-10 DIAGNOSIS — E1165 Type 2 diabetes mellitus with hyperglycemia: Secondary | ICD-10-CM

## 2024-02-10 DIAGNOSIS — Z125 Encounter for screening for malignant neoplasm of prostate: Secondary | ICD-10-CM

## 2024-02-10 DIAGNOSIS — I152 Hypertension secondary to endocrine disorders: Secondary | ICD-10-CM

## 2024-02-10 DIAGNOSIS — Z1212 Encounter for screening for malignant neoplasm of rectum: Secondary | ICD-10-CM

## 2024-02-10 DIAGNOSIS — E782 Mixed hyperlipidemia: Secondary | ICD-10-CM

## 2024-02-10 LAB — POCT GLYCOSYLATED HEMOGLOBIN (HGB A1C): Hemoglobin A1C: 7.2 % — AB (ref 4.0–5.6)

## 2024-02-10 MED ORDER — METOPROLOL SUCCINATE ER 100 MG PO TB24: 100.0000 mg | ORAL_TABLET | Freq: Every day | ORAL | 3 refills | Status: AC

## 2024-02-10 MED ORDER — DAPAGLIFLOZIN PROPANEDIOL 10 MG PO TABS
10.0000 mg | ORAL_TABLET | Freq: Every day | ORAL | 1 refills | Status: DC
Start: 2024-02-10 — End: 2024-03-27

## 2024-02-10 MED ORDER — GLIMEPIRIDE 4 MG PO TABS: ORAL_TABLET | ORAL | 1 refills | Status: DC

## 2024-02-10 MED ORDER — PRAVASTATIN SODIUM 40 MG PO TABS: ORAL_TABLET | ORAL | 3 refills | Status: AC

## 2024-02-10 MED ORDER — PNEUMOCOCCAL 20-VAL CONJ VACC 0.5 ML IM SUSY: 0.5000 mL | PREFILLED_SYRINGE | INTRAMUSCULAR | 0 refills | Status: AC

## 2024-02-10 MED ORDER — METFORMIN HCL 1000 MG PO TABS: ORAL_TABLET | ORAL | 3 refills | Status: AC

## 2024-02-10 MED ORDER — HYDROCHLOROTHIAZIDE 12.5 MG PO TABS: 12.5000 mg | ORAL_TABLET | Freq: Every day | ORAL | 3 refills | Status: AC

## 2024-02-10 MED ORDER — RAMIPRIL 10 MG PO CAPS: 10.0000 mg | ORAL_CAPSULE | Freq: Every day | ORAL | 3 refills | Status: AC

## 2024-02-10 NOTE — Telephone Encounter (Signed)
 Awaiting 02/10/24 office notes for Ophthalmology referral-Toni

## 2024-02-10 NOTE — Progress Notes (Signed)
 [facility] [address] [place], [place] [address]  Internal MEDICINE  Office Visit Note  Patient Name: Joseph Stark  [id number]  [id number]  Date of Service: 03/07/2024  Chief Complaint  Patient presents with   Hypertension   Diabetes   Annual Exam    HPI Joseph Stark presents for an annual well visit and physical exam.  Well-appearing 63 y.o. male with diabetes, hypertension, GERD, asthma, osteoarthritis, low back pain, and high cholesterol.  Routine CRC screening: due for cologuard test now.  Eye exam: needs new referral, [facility] does not take his insurance.  foot exam: done today  Labs: due for routine labs  New or worsening pain: chronic pain  Other concerns: chronic back and neck pain A1c is elevated at 7.2 today. Patient reports that he has not been able to be very active due to cold weather and will be moving around more now that it is starting to warm up.    Functional Status Survey: Is the patient deaf or have difficulty hearing?: No Does the patient have difficulty seeing, even when wearing glasses/contacts?: No Does the patient have difficulty concentrating, remembering, or making decisions?: No Does the patient have difficulty walking or climbing stairs?: No Does the patient have difficulty dressing or bathing?: No Does the patient have difficulty doing errands alone such as visiting a doctor's office or shopping?: No     04/19/2022    9:28 AM 07/31/2022   10:10 AM 10/30/2022    9:13 AM 01/21/2023    9:11 AM 02/10/2024   10:07 AM  Fall Risk  Falls in the past year? 0 0 1 1 0  Was there an injury with Fall?   0 1 0  Fall Risk Category Calculator   1 2 0  Fall Risk Category (Retired)   Low    (RETIRED) Patient Fall Risk Level Low fall risk  Low fall risk    Patient at Risk for Falls Due to No Fall Risks   History of fall(s);Impaired balance/gait;Impaired mobility No Fall Risks  Fall risk Follow up Falls evaluation completed  Falls  evaluation completed Falls evaluation completed;Education provided;Falls prevention discussed Falls evaluation completed       02/10/2024   10:07 AM  Depression screen PHQ 2/9  Decreased Interest 0  Down, Depressed, Hopeless 0  PHQ - 2 Score 0      Current Medication: Outpatient Encounter Medications as of 02/10/2024  Medication Sig   Accu-Chek Softclix Lancets lancets USE AS DIRECTED TO CHECK BLOOD SUGARS THREE TIMES A DAY DX CODE E11.9   Blood Glucose Monitoring Suppl (ACCU-CHEK GUIDE ME) w/Device KIT Use as directed   fexofenadine (ALLEGRA) 180 MG tablet Take 180 mg by mouth daily.   gabapentin (NEURONTIN) 300 MG capsule TAKE ONE CAPSULE BY MOUTH EVERY MORNING, 1 AT MIDDAY AND 2 AT AT BEDTIME   glucose blood (ACCU-CHEK GUIDE) test strip Use as instructed three  a day DX E11.65   meloxicam (MOBIC) 7.5 MG tablet TAKE 1 TABLET BY MOUTH 2 TIMES DAILY.   morphine (MSIR) 30 MG tablet Take 30 mg by mouth every 12 (twelve) hours.   mupirocin ointment (BACTROBAN) 2 % Apply 1 Application topically daily. Apply once a day to open sores on legs   omeprazole (PRILOSEC) 40 MG capsule TAKE ONE CAPSULE DAILY FOR HEARTBURN   [EXPIRED] pneumococcal 20-valent conjugate vaccine (PREVNAR 20) 0.5 ML injection Inject 0.5 mLs into the muscle tomorrow at 10 am for 1 dose.   promethazine (  PHENERGAN) 12.5 MG tablet Take 12.5 mg by mouth 2 (two) times daily as needed for nausea or vomiting.   Semaglutide, 1 MG/DOSE, (OZEMPIC, 1 MG/DOSE,) 4 MG/3ML SOPN Inject 1 mg into the skin once a week.   tacrolimus (PROTOPIC) 0.1 % ointment Apply topically daily. Apply to eczema on lower legs once a day until clear, then as needed for flares   Urea 39.5 % CREA Apply 1 Application topically as directed. Apply to thick skin on left lower leg 3 nights a week, Monday, Wednesday and Friday, until resolved, then as needed for flares   [DISCONTINUED] dapagliflozin propanediol (FARXIGA) 10 MG TABS tablet Take 1 tablet (10 mg total)  by mouth daily.   [DISCONTINUED] glimepiride (AMARYL) 4 MG tablet TAKE 1 TABLET BY MOUTH TWICE A DAY AFTER MEAL   [DISCONTINUED] hydrochlorothiazide (HYDRODIURIL) 12.5 MG tablet TAKE 1 TABLET BY MOUTH EVERY DAY   [DISCONTINUED] metFORMIN (GLUCOPHAGE) 1000 MG tablet TAKE 1 TABLET BY MOUTH IN AM AND TAKE 1/2 TABLET IN PM   [DISCONTINUED] metoprolol succinate (TOPROL-XL) 100 MG 24 hr tablet TAKE 1 TABLET BY MOUTH DAILY. TAKE WITH OR IMMEDIATELY FOLLOWING A MEAL.   [DISCONTINUED] pravastatin (PRAVACHOL) 40 MG tablet TAKE 1 TABLET BY MOUTH EVERYDAY AT BEDTIME   [DISCONTINUED] ramipril (ALTACE) 10 MG capsule Take 1 capsule (10 mg total) by mouth daily.   dapagliflozin propanediol (FARXIGA) 10 MG TABS tablet Take 1 tablet (10 mg total) by mouth daily.   glimepiride (AMARYL) 4 MG tablet TAKE 1 TABLET BY MOUTH TWICE A DAY AFTER MEAL   hydrochlorothiazide (HYDRODIURIL) 12.5 MG tablet Take 1 tablet (12.5 mg total) by mouth daily.   metFORMIN (GLUCOPHAGE) 1000 MG tablet TAKE 1 TABLET BY MOUTH IN AM AND TAKE 1/2 TABLET IN PM   metoprolol succinate (TOPROL-XL) 100 MG 24 hr tablet Take 1 tablet (100 mg total) by mouth daily. TAKE WITH OR IMMEDIATELY FOLLOWING A MEAL.   pravastatin (PRAVACHOL) 40 MG tablet TAKE 1 TABLET BY MOUTH EVERYDAY AT BEDTIME   ramipril (ALTACE) 10 MG capsule Take 1 capsule (10 mg total) by mouth daily.   No facility-administered encounter medications on file as of 02/10/2024.    Surgical History: History reviewed. No pertinent surgical history.  Medical History: Past Medical History:  Diagnosis Date   Compressed cervical disc    Diabetes mellitus without complication (HCC)    Hypertension    Renal disorder    Seizures (HCC)     Family History: Family History  Problem Relation Age of Onset   Ovarian cancer Mother    Lymphoma Father     Social History   Socioeconomic History   Marital status: Single    Spouse name: Not on file   Number of children: Not on file   Years  of education: Not on file   Highest education level: Not on file  Occupational History   Not on file  Tobacco Use   Smoking status: Former    Types: Cigarettes   Smokeless tobacco: Never   Tobacco comments:    quit 30 years ago  Vaping Use   Vaping status: Never Used  Substance and Sexual Activity   Alcohol use: No   Drug use: No   Sexual activity: Not Currently  Other Topics Concern   Not on file  Social History Narrative   Not on file   Social Drivers of Health   Financial Resource Strain: Not on file  Food Insecurity: Not on file  Transportation Needs: Not on file  Physical Activity: Not on file  Stress: Not on file  Social Connections: Not on file  Intimate Partner Violence: Not on file      Review of Systems  Constitutional:  Negative for activity change, appetite change, chills, fatigue, fever and unexpected weight change.  HENT: Negative.  Negative for congestion, ear pain, rhinorrhea, sore throat and trouble swallowing.   Eyes: Negative.   Respiratory: Negative.  Negative for cough, chest tightness, shortness of breath and wheezing.   Cardiovascular: Negative.  Negative for chest pain and palpitations.  Gastrointestinal: Negative.  Negative for abdominal pain, blood in stool, constipation, diarrhea, nausea and vomiting.  Endocrine: Negative.   Genitourinary: Negative.  Negative for difficulty urinating, dysuria, frequency, hematuria and urgency.  Musculoskeletal:  Positive for arthralgias, back pain, gait problem (difficulty walking more than 1/4 mile, unsteady on uneven terrain) and neck pain (chronic since fall last year). Negative for joint swelling and myalgias.  Skin: Negative.  Negative for rash and wound.  Allergic/Immunologic: Negative.  Negative for immunocompromised state.  Neurological:  Negative for dizziness, seizures, numbness and headaches.  Hematological: Negative.   Psychiatric/Behavioral: Negative.  Negative for behavioral problems,  self-injury and suicidal ideas. The patient is not nervous/anxious.     Vital Signs: BP 132/76   Pulse 70   Temp 98.2 F (36.8 C)   Resp 16   Ht 5\' 11"  (1.803 m)   Wt 203 lb 3.2 oz (92.2 kg)   SpO2 95%   BMI 28.34 kg/m    Physical Exam Vitals reviewed.  Constitutional:      General: He is awake. He is not in acute distress.    Appearance: Normal appearance. He is well-developed and well-groomed. He is obese. He is not ill-appearing or diaphoretic.  HENT:     Head: Normocephalic and atraumatic.     Right Ear: Tympanic membrane, ear canal and external ear normal. There is no impacted cerumen.     Left Ear: Tympanic membrane, ear canal and external ear normal. There is no impacted cerumen.     Ears:     Comments: Sees ENT yearly for ear wash    Nose: Nose normal. No congestion or rhinorrhea.     Mouth/Throat:     Lips: Pink.     Mouth: Mucous membranes are moist.     Pharynx: Oropharynx is clear. Uvula midline. No oropharyngeal exudate or posterior oropharyngeal erythema.  Eyes:     General: Lids are normal. Vision grossly intact. Gaze aligned appropriately. No scleral icterus.       Right eye: No discharge.        Left eye: No discharge.     Extraocular Movements: Extraocular movements intact.     Conjunctiva/sclera: Conjunctivae normal.     Pupils: Pupils are equal, round, and reactive to light.     Funduscopic exam:    Right eye: Red reflex present.        Left eye: Red reflex present. Neck:     Thyroid: No thyromegaly.     Vascular: No JVD.     Trachea: Trachea and phonation normal. No tracheal deviation.  Cardiovascular:     Rate and Rhythm: Normal rate. Rhythm irregular.     Pulses: Normal pulses.          Dorsalis pedis pulses are 2+ on the right side and 2+ on the left side.       Posterior tibial pulses are 2+ on the right side and 2+ on the left side.  Heart sounds: Normal heart sounds, S1 normal and S2 normal. No murmur heard.    No friction rub. No  gallop.  Pulmonary:     Effort: Pulmonary effort is normal. No respiratory distress.     Breath sounds: Normal breath sounds. No stridor. No wheezing or rales.  Chest:     Chest wall: No tenderness.  Abdominal:     General: Bowel sounds are normal. There is no distension.     Palpations: Abdomen is soft. There is no mass.     Tenderness: There is no abdominal tenderness. There is no guarding or rebound.  Musculoskeletal:        General: No tenderness or deformity. Normal range of motion.     Cervical back: Normal range of motion and neck supple.     Right foot: Normal range of motion. No deformity, bunion, Charcot foot, foot drop or prominent metatarsal heads.     Left foot: Normal range of motion. Bunion present. No deformity, Charcot foot, foot drop or prominent metatarsal heads.  Feet:     Right foot:     Protective Sensation: 6 sites tested.  6 sites sensed.     Skin integrity: Callus, dry skin and fissure present. No ulcer, blister, skin breakdown, erythema or warmth.     Toenail Condition: Right toenails are abnormally thick and long.     Left foot:     Protective Sensation: 6 sites tested.  6 sites sensed.     Skin integrity: Callus and dry skin present. No ulcer, blister, skin breakdown, erythema, warmth or fissure.     Toenail Condition: Left toenails are abnormally thick and long.  Lymphadenopathy:     Cervical: No cervical adenopathy.  Skin:    General: Skin is warm and dry.     Capillary Refill: Capillary refill takes less than 2 seconds.     Coloration: Skin is not pale.     Findings: No erythema or rash.  Neurological:     Mental Status: He is alert and oriented to person, place, and time.     Cranial Nerves: No cranial nerve deficit.     Motor: No abnormal muscle tone.     Coordination: Coordination normal.     Gait: Gait abnormal (due to low back pain and sciatica).     Deep Tendon Reflexes: Reflexes are normal and symmetric.  Psychiatric:        Mood and  Affect: Mood normal.        Behavior: Behavior normal. Behavior is cooperative.        Thought Content: Thought content normal.        Judgment: Judgment normal.        Assessment/Plan: 1. Encounter for routine adult health examination with abnormal findings (Primary) Age-appropriate preventive screenings and vaccinations discussed, annual physical exam completed. Routine labs for health maintenance, see below. PHM updated.   - CBC with Differential/Platelet - CMP14+EGFR - Lipid Profile  2. Type 2 diabetes mellitus with other specified complication, without long-term current use of insulin (HCC) A1c slightly elevated at 7.2. referred to ophthalmology for routine annual diabetic eye exams, routine labs ordered. Continue metformin, glipizide, farxiga and ozempic as prescribed.  - POCT glycosylated hemoglobin (Hb A1C) - Ambulatory referral to Ophthalmology - CBC with Differential/Platelet - CMP14+EGFR - Lipid Profile - metFORMIN (GLUCOPHAGE) 1000 MG tablet; TAKE 1 TABLET BY MOUTH IN AM AND TAKE 1/2 TABLET IN PM  Dispense: 135 tablet; Refill: 3 - glimepiride (AMARYL) 4 MG tablet; TAKE  1 TABLET BY MOUTH TWICE A DAY AFTER MEAL  Dispense: 180 tablet; Refill: 1 - dapagliflozin propanediol (FARXIGA) 10 MG TABS tablet; Take 1 tablet (10 mg total) by mouth daily.  Dispense: 90 tablet; Refill: 1 - Urine Microalbumin w/creat. ratio  3. Hypertension associated with diabetes (HCC) Stable, continue ramipril, metoprolol and hydrochlorothiazide as prescribed. Routine labs ordered  - CBC with Differential/Platelet - CMP14+EGFR - Lipid Profile - ramipril (ALTACE) 10 MG capsule; Take 1 capsule (10 mg total) by mouth daily.  Dispense: 90 capsule; Refill: 3 - metoprolol succinate (TOPROL-XL) 100 MG 24 hr tablet; Take 1 tablet (100 mg total) by mouth daily. TAKE WITH OR IMMEDIATELY FOLLOWING A MEAL.  Dispense: 90 tablet; Refill: 3 - hydrochlorothiazide (HYDRODIURIL) 12.5 MG tablet; Take 1 tablet (12.5 mg  total) by mouth daily.  Dispense: 90 tablet; Refill: 3  4. Hyperlipidemia associated with type 2 diabetes mellitus (HCC) Continue pravastatin as prescribed. Routine labs ordered  - CBC with Differential/Platelet - CMP14+EGFR - Lipid Profile - pravastatin (PRAVACHOL) 40 MG tablet; TAKE 1 TABLET BY MOUTH EVERYDAY AT BEDTIME  Dispense: 90 tablet; Refill: 3  5. Chronic bilateral low back pain with bilateral sciatica Continue meloxicam and gabapentin as prescribed. And sees pain management specialist and takes morphine tablets.   6. Vitamin D deficiency Routine lab ordered  - Vitamin D (25 hydroxy)  7. Screening for prostate cancer PSA lab ordered  - PSA Total (Reflex To Free)  8. Screening for colorectal cancer Cologuard test ordered  - Cologuard      General Counseling: Joseph Stark understanding of the findings of todays visit and agrees with plan of treatment. I have discussed any further diagnostic evaluation that may be needed or ordered today. We also reviewed his medications today. he has been encouraged to call the office with any questions or concerns that should arise related to todays visit.    Orders Placed This Encounter  Procedures   Cologuard   CBC with Differential/Platelet   CMP14+EGFR   Lipid Profile   Vitamin D (25 hydroxy)   PSA Total (Reflex To Free)   Urine Microalbumin w/creat. ratio   Ambulatory referral to Ophthalmology   POCT glycosylated hemoglobin (Hb A1C)    Meds ordered this encounter  Medications   ramipril (ALTACE) 10 MG capsule    Sig: Take 1 capsule (10 mg total) by mouth daily.    Dispense:  90 capsule    Refill:  3   pravastatin (PRAVACHOL) 40 MG tablet    Sig: TAKE 1 TABLET BY MOUTH EVERYDAY AT BEDTIME    Dispense:  90 tablet    Refill:  3   metoprolol succinate (TOPROL-XL) 100 MG 24 hr tablet    Sig: Take 1 tablet (100 mg total) by mouth daily. TAKE WITH OR IMMEDIATELY FOLLOWING A MEAL.    Dispense:  90 tablet     Refill:  3   metFORMIN (GLUCOPHAGE) 1000 MG tablet    Sig: TAKE 1 TABLET BY MOUTH IN AM AND TAKE 1/2 TABLET IN PM    Dispense:  135 tablet    Refill:  3    DX Code Needed  .   hydrochlorothiazide (HYDRODIURIL) 12.5 MG tablet    Sig: Take 1 tablet (12.5 mg total) by mouth daily.    Dispense:  90 tablet    Refill:  3   glimepiride (AMARYL) 4 MG tablet    Sig: TAKE 1 TABLET BY MOUTH TWICE A DAY AFTER MEAL    Dispense:  180 tablet    Refill:  1    For future refill   dapagliflozin propanediol (FARXIGA) 10 MG TABS tablet    Sig: Take 1 tablet (10 mg total) by mouth daily.    Dispense:  90 tablet    Refill:  1   pneumococcal 20-valent conjugate vaccine (PREVNAR 20) 0.5 ML injection    Sig: Inject 0.5 mLs into the muscle tomorrow at 10 am for 1 dose.    Dispense:  0.5 mL    Refill:  0    Return in about 4 months (around 06/09/2024) for F/U, Recheck A1C, Joseph Stark PCP.   Total time spent:30 Minutes Time spent includes review of chart, medications, test results, and follow up plan with the patient.   [place] Controlled Substance Database was reviewed by me.  This patient was seen by Joseph Kuster, FNP-C in collaboration with Dr. Beverely Risen as a part of collaborative care agreement.  Joseph Stark R. Tedd Sias, MSN, FNP-C Internal medicine

## 2024-02-22 LAB — PSA TOTAL (REFLEX TO FREE): Prostate Specific Ag, Serum: 3.5 ng/mL (ref 0.0–4.0)

## 2024-02-22 LAB — LIPID PANEL
Chol/HDL Ratio: 3.5 ratio (ref 0.0–5.0)
Cholesterol, Total: 169 mg/dL (ref 100–199)
HDL: 48 mg/dL (ref >39)
LDL Chol Calc (NIH): 82 mg/dL (ref 0–99)
Triglycerides: 235 mg/dL — ABNORMAL HIGH (ref 0–149)
VLDL Cholesterol Cal: 39 mg/dL (ref 5–40)

## 2024-02-22 LAB — CBC WITH DIFFERENTIAL/PLATELET
Basophils Absolute: 0 10*3/uL (ref 0.0–0.2)
Basos: 1 %
EOS (ABSOLUTE): 0.7 10*3/uL — ABNORMAL HIGH (ref 0.0–0.4)
Eos: 8 %
Hematocrit: 40.8 % (ref 37.5–51.0)
Hemoglobin: 13.4 g/dL (ref 13.0–17.7)
Immature Grans (Abs): 0 10*3/uL (ref 0.0–0.1)
Immature Granulocytes: 0 %
Lymphocytes Absolute: 2 10*3/uL (ref 0.7–3.1)
Lymphs: 24 %
MCH: 30.9 pg (ref 26.6–33.0)
MCHC: 32.8 g/dL (ref 31.5–35.7)
MCV: 94 fL (ref 79–97)
Monocytes Absolute: 0.5 10*3/uL (ref 0.1–0.9)
Monocytes: 6 %
Neutrophils Absolute: 5.1 10*3/uL (ref 1.4–7.0)
Neutrophils: 61 %
Platelets: 249 10*3/uL (ref 150–450)
RBC: 4.34 x10E6/uL (ref 4.14–5.80)
RDW: 13.6 % (ref 11.6–15.4)
WBC: 8.4 10*3/uL (ref 3.4–10.8)

## 2024-02-22 LAB — CMP14+EGFR
ALT: 24 IU/L (ref 0–44)
AST: 19 IU/L (ref 0–40)
Albumin: 4.5 g/dL (ref 3.9–4.9)
Alkaline Phosphatase: 76 IU/L (ref 44–121)
BUN/Creatinine Ratio: 13 (ref 10–24)
BUN: 14 mg/dL (ref 8–27)
Bilirubin Total: 0.3 mg/dL (ref 0.0–1.2)
CO2: 28 mmol/L (ref 20–29)
Calcium: 10 mg/dL (ref 8.6–10.2)
Chloride: 103 mmol/L (ref 96–106)
Creatinine, Ser: 1.11 mg/dL (ref 0.76–1.27)
Globulin, Total: 2.8 g/dL (ref 1.5–4.5)
Glucose: 104 mg/dL — ABNORMAL HIGH (ref 70–99)
Potassium: 4.5 mmol/L (ref 3.5–5.2)
Sodium: 143 mmol/L (ref 134–144)
Total Protein: 7.3 g/dL (ref 6.0–8.5)
eGFR: 76 mL/min/{1.73_m2} (ref >59)

## 2024-02-22 LAB — VITAMIN D 25 HYDROXY (VIT D DEFICIENCY, FRACTURES): Vit D, 25-Hydroxy: 38.7 ng/mL (ref 30.0–100.0)

## 2024-02-23 LAB — MICROALBUMIN / CREATININE URINE RATIO
Creatinine, Urine: 87.2 mg/dL
Microalb/Creat Ratio: 18 mg/g{creat} (ref 0–29)
Microalbumin, Urine: 15.6 ug/mL

## 2024-02-27 LAB — COLOGUARD: COLOGUARD: NEGATIVE

## 2024-03-09 ENCOUNTER — Telehealth: Payer: Self-pay | Admitting: Nurse Practitioner

## 2024-03-09 NOTE — Telephone Encounter (Addendum)
 Ophthalmology referral faxed to [facility] ; [phone]. Notified patient. Gave pt telephone [phone]

## 2024-03-11 NOTE — Telephone Encounter (Signed)
error 

## 2024-03-27 ENCOUNTER — Other Ambulatory Visit: Payer: Self-pay

## 2024-03-27 DIAGNOSIS — E1169 Type 2 diabetes mellitus with other specified complication: Secondary | ICD-10-CM

## 2024-03-27 MED ORDER — DAPAGLIFLOZIN PROPANEDIOL 10 MG PO TABS: 10.0000 mg | ORAL_TABLET | Freq: Every day | ORAL | 1 refills | Status: DC

## 2024-05-29 ENCOUNTER — Other Ambulatory Visit: Payer: Self-pay

## 2024-05-29 DIAGNOSIS — S0990XA Unspecified injury of head, initial encounter: Secondary | ICD-10-CM

## 2024-06-08 ENCOUNTER — Encounter: Payer: Self-pay | Admitting: Nurse Practitioner

## 2024-06-08 ENCOUNTER — Ambulatory Visit: Payer: Medicaid Other | Admitting: Nurse Practitioner

## 2024-06-08 ENCOUNTER — Ambulatory Visit (INDEPENDENT_AMBULATORY_CARE_PROVIDER_SITE_OTHER): Admitting: Nurse Practitioner

## 2024-06-08 VITALS — BP 130/70 | HR 72 | Temp 98.1°F | Resp 16 | Ht 71.0 in | Wt 206.0 lb

## 2024-06-08 DIAGNOSIS — I152 Hypertension secondary to endocrine disorders: Secondary | ICD-10-CM

## 2024-06-08 DIAGNOSIS — E1169 Type 2 diabetes mellitus with other specified complication: Secondary | ICD-10-CM

## 2024-06-08 DIAGNOSIS — E1159 Type 2 diabetes mellitus with other circulatory complications: Secondary | ICD-10-CM

## 2024-06-08 DIAGNOSIS — E785 Hyperlipidemia, unspecified: Secondary | ICD-10-CM

## 2024-06-08 LAB — POCT GLYCOSYLATED HEMOGLOBIN (HGB A1C): Hemoglobin A1C: 7.7 % — AB (ref 4.0–5.6)

## 2024-06-08 MED ORDER — SEMAGLUTIDE (2 MG/DOSE) 8 MG/3ML ~~LOC~~ SOPN: 2.0000 mg | PEN_INJECTOR | SUBCUTANEOUS | 3 refills | Status: AC

## 2024-06-08 NOTE — Progress Notes (Signed)
 [facility] [address] [place], [place] [address]  Internal MEDICINE  Office Visit Note  Patient Name: Joseph Stark  [id number]  [id number]  Date of Service: 06/08/2024  Chief Complaint  Patient presents with   Diabetes   Hypertension   Follow-up    HPI Joseph Stark presents for a follow-up visit for diabetes, hypertension, and high cholesterol. Diabetes -- A1c is elevated at 7.7 today.  Hypertension -- controlled with metoprolol , hydrochlorothiazide , and ramipril  High cholesterol -- taking pravastatin .     Current Medication: Outpatient Encounter Medications as of 06/08/2024  Medication Sig   [START ON 07/06/2024] Semaglutide , 2 MG/DOSE, 8 MG/3ML SOPN Inject 2 mg as directed once a week.   Accu-Chek Softclix Lancets lancets USE AS DIRECTED TO CHECK BLOOD SUGARS THREE TIMES A DAY DX CODE E11.9   Blood Glucose Monitoring Suppl (ACCU-CHEK GUIDE ME) w/Device KIT Use as directed   dapagliflozin  propanediol (FARXIGA ) 10 MG TABS tablet Take 1 tablet (10 mg total) by mouth daily.   fexofenadine (ALLEGRA) 180 MG tablet Take 180 mg by mouth daily.   gabapentin  (NEURONTIN ) 300 MG capsule TAKE ONE CAPSULE BY MOUTH EVERY MORNING, 1 AT MIDDAY AND 2 AT AT BEDTIME   glimepiride  (AMARYL ) 4 MG tablet TAKE 1 TABLET BY MOUTH TWICE A DAY AFTER MEAL   glucose blood (ACCU-CHEK GUIDE) test strip Use as instructed three  a day DX E11.65   hydrochlorothiazide  (HYDRODIURIL ) 12.5 MG tablet Take 1 tablet (12.5 mg total) by mouth daily.   meloxicam  (MOBIC ) 7.5 MG tablet TAKE 1 TABLET BY MOUTH 2 TIMES DAILY.   metFORMIN  (GLUCOPHAGE ) 1000 MG tablet TAKE 1 TABLET BY MOUTH IN AM AND TAKE 1/2 TABLET IN PM   metoprolol  succinate (TOPROL -XL) 100 MG 24 hr tablet Take 1 tablet (100 mg total) by mouth daily. TAKE WITH OR IMMEDIATELY FOLLOWING A MEAL.   morphine (MSIR) 30 MG tablet Take 30 mg by mouth every 12 (twelve) hours.   mupirocin  ointment (BACTROBAN ) 2 % Apply 1 Application topically daily.  Apply once a day to open sores on legs   omeprazole  (PRILOSEC) 40 MG capsule TAKE ONE CAPSULE DAILY FOR HEARTBURN   pravastatin  (PRAVACHOL ) 40 MG tablet TAKE 1 TABLET BY MOUTH EVERYDAY AT BEDTIME   promethazine (PHENERGAN) 12.5 MG tablet Take 12.5 mg by mouth 2 (two) times daily as needed for nausea or vomiting.   ramipril  (ALTACE ) 10 MG capsule Take 1 capsule (10 mg total) by mouth daily.   tacrolimus  (PROTOPIC ) 0.1 % ointment Apply topically daily. Apply to eczema on lower legs once a day until clear, then as needed for flares   Urea  39.5 % CREA Apply 1 Application topically as directed. Apply to thick skin on left lower leg 3 nights a week, Monday, Wednesday and Friday, until resolved, then as needed for flares   [DISCONTINUED] Semaglutide , 1 MG/DOSE, (OZEMPIC , 1 MG/DOSE,) 4 MG/3ML SOPN Inject 1 mg into the skin once a week.   No facility-administered encounter medications on file as of 06/08/2024.    Surgical History: History reviewed. No pertinent surgical history.  Medical History: Past Medical History:  Diagnosis Date   Compressed cervical disc    Diabetes mellitus without complication (HCC)    Hypertension    Renal disorder    Seizures (HCC)     Family History: Family History  Problem Relation Age of Onset   Ovarian cancer Mother    Lymphoma Father     Social History   Socioeconomic History   Marital status:  Single    Spouse name: Not on file   Number of children: Not on file   Years of education: Not on file   Highest education level: Not on file  Occupational History   Not on file  Tobacco Use   Smoking status: Former    Types: Cigarettes   Smokeless tobacco: Never   Tobacco comments:    quit 30 years ago  Vaping Use   Vaping status: Never Used  Substance and Sexual Activity   Alcohol use: No   Drug use: No   Sexual activity: Not Currently  Other Topics Concern   Not on file  Social History Narrative   Not on file   Social Drivers of Health    Financial Resource Strain: Not on file  Food Insecurity: Not on file  Transportation Needs: Not on file  Physical Activity: Not on file  Stress: Not on file  Social Connections: Not on file  Intimate Partner Violence: Not on file      Review of Systems  Constitutional:  Negative for chills, fatigue and unexpected weight change.  HENT:  Negative for congestion, rhinorrhea, sneezing and sore throat.   Eyes:  Negative for redness.  Respiratory:  Negative for cough, chest tightness and shortness of breath.   Cardiovascular:  Negative for chest pain and palpitations.  Gastrointestinal:  Negative for abdominal pain, constipation, diarrhea, nausea and vomiting.  Genitourinary:  Negative for dysuria and frequency.  Musculoskeletal:  Positive for arthralgias and back pain. Negative for joint swelling and neck pain.  Skin:  Negative for rash.  Neurological:  Positive for headaches. Negative for tremors and numbness.  Hematological:  Negative for adenopathy. Does not bruise/bleed easily.  Psychiatric/Behavioral:  Negative for behavioral problems (Depression), self-injury, sleep disturbance and suicidal ideas. The patient is not nervous/anxious.     Vital Signs: BP 130/70   Pulse 72   Temp 98.1 F (36.7 C)   Resp 16   Ht 5' 11 (1.803 m)   Wt 206 lb (93.4 kg)   SpO2 93%   BMI 28.73 kg/m    Physical Exam Vitals reviewed.  Constitutional:      General: He is not in acute distress.    Appearance: Normal appearance. He is not ill-appearing.  HENT:     Head: Normocephalic and atraumatic.  Eyes:     Pupils: Pupils are equal, round, and reactive to light.  Cardiovascular:     Rate and Rhythm: Normal rate and regular rhythm.  Pulmonary:     Effort: Pulmonary effort is normal. No respiratory distress.  Neurological:     Mental Status: He is alert and oriented to person, place, and time.  Psychiatric:        Mood and Affect: Mood normal.        Behavior: Behavior normal.         Assessment/Plan: 1. Type 2 diabetes mellitus with other specified complication, without long-term current use of insulin (HCC) (Primary) A1c is slightly elevated. Continue ozempic  as prescribed. Repeat A1c in 4 months  - POCT glycosylated hemoglobin (Hb A1C) - Semaglutide , 2 MG/DOSE, 8 MG/3ML SOPN; Inject 2 mg as directed once a week.  Dispense: 9 mL; Refill: 3  2. Hypertension associated with diabetes (HCC) Stable, continue metoprolol , hydrochlorothiazide  and ramipril  as prescribed.   3. Hyperlipidemia associated with type 2 diabetes mellitus (HCC) Continue pravastatin  as prescribed.    General Counseling: gareld obrecht understanding of the findings of todays visit and agrees with plan of treatment. I  have discussed any further diagnostic evaluation that may be needed or ordered today. We also reviewed his medications today. he has been encouraged to call the office with any questions or concerns that should arise related to todays visit.    Orders Placed This Encounter  Procedures   POCT glycosylated hemoglobin (Hb A1C)    Meds ordered this encounter  Medications   Semaglutide , 2 MG/DOSE, 8 MG/3ML SOPN    Sig: Inject 2 mg as directed once a week.    Dispense:  9 mL    Refill:  3    Dx code E11.65. note increase dose, patient has enough of the 1 mg dose to last until mid to late July, please fill new script in July    Return in about 4 months (around 10/08/2024) for F/U, Recheck A1C, Aryelle Figg PCP.   Total time spent:30 Minutes Time spent includes review of chart, medications, test results, and follow up plan with the patient.   [place] Controlled Substance Database was reviewed by me.  This patient was seen by Mardy Maxin, FNP-C in collaboration with Dr. Sigrid Bathe as a part of collaborative care agreement.   Armoni Depass R. Maxin, MSN, FNP-C Internal medicine

## 2024-06-08 NOTE — Patient Instructions (Signed)
 Increase ozempic  to 2 mg weekly -- please take 2 shots of the 1 mg dose weekly until you run out of the 1 mg dose then switch to 1 injection weekly of the 2 mg dose.

## 2024-06-10 ENCOUNTER — Ambulatory Visit
Admission: RE | Admit: 2024-06-10 | Discharge: 2024-06-10 | Disposition: A | Discharge status: Home / Self Care | Source: Ambulatory Visit

## 2024-06-10 DIAGNOSIS — S0990XA Unspecified injury of head, initial encounter: Secondary | ICD-10-CM | POA: Insufficient documentation

## 2024-07-11 ENCOUNTER — Encounter: Payer: Self-pay | Admitting: Nurse Practitioner

## 2024-07-11 ENCOUNTER — Other Ambulatory Visit: Payer: Self-pay | Admitting: Nurse Practitioner

## 2024-07-11 DIAGNOSIS — Z760 Encounter for issue of repeat prescription: Secondary | ICD-10-CM

## 2024-09-10 ENCOUNTER — Other Ambulatory Visit: Payer: Self-pay | Admitting: Nurse Practitioner

## 2024-09-10 DIAGNOSIS — E1169 Type 2 diabetes mellitus with other specified complication: Secondary | ICD-10-CM

## 2024-10-05 ENCOUNTER — Ambulatory Visit: Admitting: Nurse Practitioner

## 2024-10-07 ENCOUNTER — Ambulatory Visit: Admitting: Nurse Practitioner

## 2024-10-07 ENCOUNTER — Encounter: Payer: Self-pay | Admitting: Nurse Practitioner

## 2024-10-07 VITALS — BP 130/76 | HR 84 | Temp 97.4°F | Resp 16 | Ht 71.0 in | Wt 207.4 lb

## 2024-10-07 DIAGNOSIS — E1159 Type 2 diabetes mellitus with other circulatory complications: Secondary | ICD-10-CM | POA: Diagnosis not present

## 2024-10-07 DIAGNOSIS — E1169 Type 2 diabetes mellitus with other specified complication: Secondary | ICD-10-CM | POA: Diagnosis not present

## 2024-10-07 DIAGNOSIS — E785 Hyperlipidemia, unspecified: Secondary | ICD-10-CM | POA: Diagnosis not present

## 2024-10-07 DIAGNOSIS — I152 Hypertension secondary to endocrine disorders: Secondary | ICD-10-CM

## 2024-10-07 DIAGNOSIS — E119 Type 2 diabetes mellitus without complications: Secondary | ICD-10-CM | POA: Insufficient documentation

## 2024-10-07 LAB — POCT GLYCOSYLATED HEMOGLOBIN (HGB A1C): Hemoglobin A1C: 7.6 % — AB (ref 4.0–5.6)

## 2024-10-07 MED ORDER — DAPAGLIFLOZIN PROPANEDIOL 10 MG PO TABS: 10.0000 mg | ORAL_TABLET | Freq: Every day | ORAL | 1 refills | Status: AC

## 2024-10-07 NOTE — Progress Notes (Signed)
 [facility] [address] [place], [place] [address]  Internal MEDICINE  Office Visit Note  Patient Name: Joseph Stark  [id number]  [id number]  Date of Service: 10/07/2024  Chief Complaint  Patient presents with   Diabetes   Hypertension   Follow-up    HPI Joseph Stark presents for a follow-up visit fordiabetes, refills and screenings.  Diabetes -- A1c is slightly improved to 7.6 from 7.7 in June earleir this year.  Needs eye exam for this year  Due for prevnar 20 vaccine Sees podiatry for diabetic foot care   Current Medication: Outpatient Encounter Medications as of 10/07/2024  Medication Sig   Accu-Chek Softclix Lancets lancets USE AS DIRECTED TO CHECK BLOOD SUGARS THREE TIMES A DAY DX CODE E11.9   Blood Glucose Monitoring Suppl (ACCU-CHEK GUIDE ME) w/Device KIT Use as directed   dapagliflozin  propanediol (FARXIGA ) 10 MG TABS tablet Take 1 tablet (10 mg total) by mouth daily.   fexofenadine (ALLEGRA) 180 MG tablet Take 180 mg by mouth daily.   gabapentin  (NEURONTIN ) 300 MG capsule TAKE ONE CAPSULE BY MOUTH EVERY MORNING, 1 AT MIDDAY AND 2 AT AT BEDTIME   glimepiride  (AMARYL ) 4 MG tablet TAKE 1 TABLET BY MOUTH TWICE A DAY AFTER MEAL   glucose blood (ACCU-CHEK GUIDE) test strip Use as instructed three  a day DX E11.65   hydrochlorothiazide  (HYDRODIURIL ) 12.5 MG tablet Take 1 tablet (12.5 mg total) by mouth daily.   meloxicam  (MOBIC ) 7.5 MG tablet TAKE 1 TABLET BY MOUTH 2 TIMES DAILY.   metFORMIN  (GLUCOPHAGE ) 1000 MG tablet TAKE 1 TABLET BY MOUTH IN AM AND TAKE 1/2 TABLET IN PM   metoprolol  succinate (TOPROL -XL) 100 MG 24 hr tablet Take 1 tablet (100 mg total) by mouth daily. TAKE WITH OR IMMEDIATELY FOLLOWING A MEAL.   morphine (MSIR) 30 MG tablet Take 30 mg by mouth every 12 (twelve) hours.   mupirocin  ointment (BACTROBAN ) 2 % Apply 1 Application topically daily. Apply once a day to open sores on legs (Patient not taking: Reported on 10/07/2024)   omeprazole   (PRILOSEC) 40 MG capsule TAKE ONE CAPSULE DAILY FOR HEARTBURN   pravastatin  (PRAVACHOL ) 40 MG tablet TAKE 1 TABLET BY MOUTH EVERYDAY AT BEDTIME   promethazine (PHENERGAN) 12.5 MG tablet Take 12.5 mg by mouth 2 (two) times daily as needed for nausea or vomiting.   ramipril  (ALTACE ) 10 MG capsule Take 1 capsule (10 mg total) by mouth daily.   Semaglutide , 2 MG/DOSE, 8 MG/3ML SOPN Inject 2 mg as directed once a week.   tacrolimus  (PROTOPIC ) 0.1 % ointment Apply topically daily. Apply to eczema on lower legs once a day until clear, then as needed for flares   Urea  39.5 % CREA Apply 1 Application topically as directed. Apply to thick skin on left lower leg 3 nights a week, Monday, Wednesday and Friday, until resolved, then as needed for flares   [DISCONTINUED] dapagliflozin  propanediol (FARXIGA ) 10 MG TABS tablet Take 1 tablet (10 mg total) by mouth daily.   No facility-administered encounter medications on file as of 10/07/2024.    Surgical History: History reviewed. No pertinent surgical history.  Medical History: Past Medical History:  Diagnosis Date   Compressed cervical disc    Diabetes mellitus without complication (HCC)    Hypertension    Renal disorder    Seizures (HCC)     Family History: Family History  Problem Relation Age of Onset   Ovarian cancer Mother    Lymphoma Father  Social History   Socioeconomic History   Marital status: Single    Spouse name: Not on file   Number of children: Not on file   Years of education: Not on file   Highest education level: Not on file  Occupational History   Not on file  Tobacco Use   Smoking status: Former    Types: Cigarettes   Smokeless tobacco: Never   Tobacco comments:    quit 30 years ago  Vaping Use   Vaping status: Never Used  Substance and Sexual Activity   Alcohol use: No   Drug use: No   Sexual activity: Not Currently  Other Topics Concern   Not on file  Social History Narrative   Not on file   Social  Drivers of Health   Financial Resource Strain: Low Risk  (07/28/2024)   Received from [facility]   Overall Financial Resource Strain (CARDIA)    Difficulty of Paying Living Expenses: Not very hard  Food Insecurity: Food Insecurity Present (07/28/2024)   Received from [facility]   Hunger Vital Sign    Within the past 12 months, you worried that your food would run out before you got the money to buy more.: Sometimes true    Within the past 12 months, the food you bought just didn't last and you didn't have money to get more.: Often true  Transportation Needs: No Transportation Needs (07/28/2024)   Received from [facility] - Transportation    In the past 12 months, has lack of transportation kept you from medical appointments or from getting medications?: No    Lack of Transportation (Non-Medical): No  Physical Activity: Not on file  Stress: Not on file  Social Connections: Not on file  Intimate Partner Violence: Not on file      Review of Systems  Constitutional:  Negative for chills, fatigue and unexpected weight change.  HENT:  Negative for congestion, rhinorrhea, sneezing and sore throat.   Eyes:  Negative for redness.  Respiratory:  Negative for cough, chest tightness and shortness of breath.   Cardiovascular:  Negative for chest pain and palpitations.  Gastrointestinal:  Negative for abdominal pain, constipation, diarrhea, nausea and vomiting.  Genitourinary:  Negative for dysuria and frequency.  Musculoskeletal:  Positive for arthralgias and back pain. Negative for joint swelling and neck pain.  Skin:  Negative for rash.  Neurological:  Positive for headaches. Negative for tremors and numbness.  Hematological:  Negative for adenopathy. Does not bruise/bleed easily.  Psychiatric/Behavioral:  Negative for behavioral problems (Depression), self-injury, sleep disturbance and suicidal ideas. The patient is not  nervous/anxious.     Vital Signs: BP 130/76   Pulse 84   Temp (!) 97.4 F (36.3 C)   Resp 16   Ht 5' 11 (1.803 m)   Wt 207 lb 6.4 oz (94.1 kg)   SpO2 93%   BMI 28.93 kg/m    Physical Exam Vitals reviewed.  Constitutional:      General: He is not in acute distress.    Appearance: Normal appearance. He is not ill-appearing.  HENT:     Head: Normocephalic and atraumatic.  Eyes:     Pupils: Pupils are equal, round, and reactive to light.  Cardiovascular:     Rate and Rhythm: Normal rate and regular rhythm.  Pulmonary:     Effort: Pulmonary effort is normal. No respiratory distress.  Neurological:     Mental Status: He is  alert and oriented to person, place, and time.  Psychiatric:        Mood and Affect: Mood normal.        Behavior: Behavior normal.        Assessment/Plan: 1. Type 2 diabetes mellitus with other specified complication, without long-term current use of insulin (HCC) (Primary) A1c slightly improved but still elevated. Continue medications as prescribed.  - POCT glycosylated hemoglobin (Hb A1C) - dapagliflozin  propanediol (FARXIGA ) 10 MG TABS tablet; Take 1 tablet (10 mg total) by mouth daily.  Dispense: 90 tablet; Refill: 1  2. Hypertension associated with diabetes (HCC) Conitnue Metoprolol , hydrochlorothiazide  and ramipril  as prescribed.   3. Hyperlipidemia associated with type 2 diabetes mellitus (HCC) Continue pravastatin  as prescribed.    General Counseling: Joseph Stark understanding of the findings of todays visit and agrees with plan of treatment. I have discussed any further diagnostic evaluation that may be needed or ordered today. We also reviewed his medications today. he has been encouraged to call the office with any questions or concerns that should arise related to todays visit.    Orders Placed This Encounter  Procedures   POCT glycosylated hemoglobin (Hb A1C)    Meds ordered this encounter  Medications   dapagliflozin   propanediol (FARXIGA ) 10 MG TABS tablet    Sig: Take 1 tablet (10 mg total) by mouth daily.    Dispense:  90 tablet    Refill:  1    Return today (on 10/07/2024) for previously scheduled, CPE, Joseph Stark PCP in february 2026, and otherwise as needed. .   Total time spent:30 Minutes Time spent includes review of chart, medications, test results, and follow up plan with the patient.   [place] Controlled Substance Database was reviewed by me.  This patient was seen by Mardy Maxin, FNP-C in collaboration with Dr. Sigrid Bathe as a part of collaborative care agreement.   Verenis Nicosia R. Maxin, MSN, FNP-C Internal medicine

## 2024-10-09 ENCOUNTER — Other Ambulatory Visit: Payer: Self-pay | Admitting: Nurse Practitioner

## 2024-10-09 DIAGNOSIS — Z760 Encounter for issue of repeat prescription: Secondary | ICD-10-CM

## 2025-01-05 ENCOUNTER — Other Ambulatory Visit: Payer: Self-pay | Admitting: Nurse Practitioner

## 2025-01-05 DIAGNOSIS — K219 Gastro-esophageal reflux disease without esophagitis: Secondary | ICD-10-CM

## 2025-02-10 ENCOUNTER — Encounter: Payer: Medicaid Other | Admitting: Nurse Practitioner
